# Patient Record
Sex: Female | Born: 1955 | Race: White | Hispanic: No | Marital: Married | Smoking: Former smoker
Health system: Southern US, Community
[De-identification: ages and names within clinical notes are randomized; demographics above are authoritative.]

## PROBLEM LIST (undated history)

## (undated) DIAGNOSIS — D497 Neoplasm of unspecified behavior of endocrine glands and other parts of nervous system: Secondary | ICD-10-CM

## (undated) DIAGNOSIS — M8580 Other specified disorders of bone density and structure, unspecified site: Secondary | ICD-10-CM

## (undated) DIAGNOSIS — K589 Irritable bowel syndrome without diarrhea: Secondary | ICD-10-CM

## (undated) DIAGNOSIS — E213 Hyperparathyroidism, unspecified: Secondary | ICD-10-CM

## (undated) DIAGNOSIS — C44722 Squamous cell carcinoma of skin of right lower limb, including hip: Secondary | ICD-10-CM

## (undated) DIAGNOSIS — K649 Unspecified hemorrhoids: Secondary | ICD-10-CM

## (undated) DIAGNOSIS — E538 Deficiency of other specified B group vitamins: Secondary | ICD-10-CM

## (undated) DIAGNOSIS — F329 Major depressive disorder, single episode, unspecified: Secondary | ICD-10-CM

## (undated) DIAGNOSIS — G43009 Migraine without aura, not intractable, without status migrainosus: Secondary | ICD-10-CM

## (undated) DIAGNOSIS — F32A Depression, unspecified: Secondary | ICD-10-CM

## (undated) DIAGNOSIS — N951 Menopausal and female climacteric states: Secondary | ICD-10-CM

## (undated) DIAGNOSIS — K5190 Ulcerative colitis, unspecified, without complications: Secondary | ICD-10-CM

## (undated) DIAGNOSIS — D649 Anemia, unspecified: Secondary | ICD-10-CM

## (undated) HISTORY — PX: PARATHYROIDECTOMY: SHX19

## (undated) HISTORY — DX: Anemia, unspecified: D64.9

## (undated) HISTORY — PX: COLONOSCOPY: SHX174

## (undated) HISTORY — DX: Irritable bowel syndrome, unspecified: K58.9

## (undated) HISTORY — DX: Depression, unspecified: F32.A

## (undated) HISTORY — DX: Menopausal and female climacteric states: N95.1

## (undated) HISTORY — DX: Migraine without aura, not intractable, without status migrainosus: G43.009

## (undated) HISTORY — DX: Squamous cell carcinoma of skin of right lower limb, including hip: C44.722

## (undated) HISTORY — PX: SQUAMOUS CELL CARCINOMA EXCISION: SHX2433

## (undated) HISTORY — DX: Ulcerative colitis, unspecified, without complications: K51.90

## (undated) HISTORY — DX: Major depressive disorder, single episode, unspecified: F32.9

## (undated) HISTORY — DX: Unspecified hemorrhoids: K64.9

## (undated) HISTORY — DX: Neoplasm of unspecified behavior of endocrine glands and other parts of nervous system: D49.7

## (undated) HISTORY — DX: Hyperparathyroidism, unspecified: E21.3

## (undated) HISTORY — DX: Deficiency of other specified B group vitamins: E53.8

## (undated) HISTORY — DX: Other specified disorders of bone density and structure, unspecified site: M85.80

---

## 1993-10-31 HISTORY — PX: EXPLORATORY LAPAROTOMY: SUR591

## 1993-11-28 HISTORY — PX: PELVIC LAPAROSCOPY: SHX162

## 1995-08-11 HISTORY — PX: LAPAROSCOPIC VAGINAL HYSTERECTOMY: SUR798

## 1996-06-30 DIAGNOSIS — K649 Unspecified hemorrhoids: Secondary | ICD-10-CM

## 1996-06-30 HISTORY — DX: Unspecified hemorrhoids: K64.9

## 1998-04-28 ENCOUNTER — Other Ambulatory Visit: Admission: RE | Admit: 1998-04-28 | Discharge: 1998-04-28 | Payer: Self-pay | Admitting: Gastroenterology

## 1998-08-30 DIAGNOSIS — D497 Neoplasm of unspecified behavior of endocrine glands and other parts of nervous system: Secondary | ICD-10-CM

## 1998-08-30 HISTORY — DX: Neoplasm of unspecified behavior of endocrine glands and other parts of nervous system: D49.7

## 1998-11-09 ENCOUNTER — Ambulatory Visit (HOSPITAL_COMMUNITY): Admission: RE | Admit: 1998-11-09 | Discharge: 1998-11-09 | Payer: Self-pay

## 1998-11-21 ENCOUNTER — Inpatient Hospital Stay (HOSPITAL_COMMUNITY): Admission: RE | Admit: 1998-11-21 | Discharge: 1998-11-22 | Payer: Self-pay

## 1999-01-12 ENCOUNTER — Ambulatory Visit (HOSPITAL_COMMUNITY): Admission: RE | Admit: 1999-01-12 | Discharge: 1999-01-12 | Payer: Self-pay

## 1999-01-18 ENCOUNTER — Ambulatory Visit (HOSPITAL_COMMUNITY): Admission: RE | Admit: 1999-01-18 | Discharge: 1999-01-18 | Payer: Self-pay

## 1999-08-22 ENCOUNTER — Encounter: Admission: RE | Admit: 1999-08-22 | Discharge: 1999-08-22 | Payer: Self-pay | Admitting: Gastroenterology

## 1999-08-22 ENCOUNTER — Encounter: Payer: Self-pay | Admitting: Gastroenterology

## 1999-08-27 ENCOUNTER — Inpatient Hospital Stay (HOSPITAL_COMMUNITY): Admission: EM | Admit: 1999-08-27 | Discharge: 1999-09-04 | Payer: Self-pay | Admitting: Emergency Medicine

## 1999-08-27 ENCOUNTER — Encounter: Payer: Self-pay | Admitting: Emergency Medicine

## 1999-08-27 ENCOUNTER — Encounter (INDEPENDENT_AMBULATORY_CARE_PROVIDER_SITE_OTHER): Payer: Self-pay | Admitting: Specialist

## 2000-02-14 ENCOUNTER — Ambulatory Visit (HOSPITAL_COMMUNITY): Admission: AD | Admit: 2000-02-14 | Discharge: 2000-02-14 | Payer: Self-pay | Admitting: *Deleted

## 2000-03-21 ENCOUNTER — Ambulatory Visit (HOSPITAL_COMMUNITY): Admission: RE | Admit: 2000-03-21 | Discharge: 2000-03-21 | Payer: Self-pay

## 2000-03-24 ENCOUNTER — Emergency Department (HOSPITAL_COMMUNITY): Admission: EM | Admit: 2000-03-24 | Discharge: 2000-03-24 | Payer: Self-pay | Admitting: Emergency Medicine

## 2000-08-04 ENCOUNTER — Encounter (INDEPENDENT_AMBULATORY_CARE_PROVIDER_SITE_OTHER): Payer: Self-pay | Admitting: *Deleted

## 2000-08-05 ENCOUNTER — Inpatient Hospital Stay (HOSPITAL_COMMUNITY): Admission: AD | Admit: 2000-08-05 | Discharge: 2000-08-06 | Payer: Self-pay

## 2000-12-05 ENCOUNTER — Encounter: Payer: Self-pay | Admitting: Family Medicine

## 2000-12-05 ENCOUNTER — Ambulatory Visit (HOSPITAL_COMMUNITY): Admission: RE | Admit: 2000-12-05 | Discharge: 2000-12-05 | Payer: Self-pay | Admitting: Family Medicine

## 2000-12-31 ENCOUNTER — Other Ambulatory Visit: Admission: RE | Admit: 2000-12-31 | Discharge: 2000-12-31 | Payer: Self-pay | Admitting: *Deleted

## 2002-01-05 ENCOUNTER — Ambulatory Visit (HOSPITAL_BASED_OUTPATIENT_CLINIC_OR_DEPARTMENT_OTHER): Admission: RE | Admit: 2002-01-05 | Discharge: 2002-01-06 | Payer: Self-pay | Admitting: Otolaryngology

## 2005-04-23 ENCOUNTER — Ambulatory Visit: Payer: Self-pay | Admitting: Gastroenterology

## 2005-05-01 ENCOUNTER — Ambulatory Visit: Payer: Self-pay | Admitting: Gastroenterology

## 2005-05-08 ENCOUNTER — Ambulatory Visit: Payer: Self-pay | Admitting: Gastroenterology

## 2005-05-15 ENCOUNTER — Ambulatory Visit: Payer: Self-pay | Admitting: Gastroenterology

## 2005-06-13 ENCOUNTER — Ambulatory Visit: Payer: Self-pay | Admitting: Gastroenterology

## 2005-07-17 ENCOUNTER — Ambulatory Visit: Payer: Self-pay | Admitting: Gastroenterology

## 2005-08-30 ENCOUNTER — Ambulatory Visit: Payer: Self-pay | Admitting: Gastroenterology

## 2005-10-04 ENCOUNTER — Ambulatory Visit: Payer: Self-pay | Admitting: Gastroenterology

## 2005-11-15 ENCOUNTER — Ambulatory Visit: Payer: Self-pay | Admitting: Gastroenterology

## 2006-01-22 ENCOUNTER — Ambulatory Visit: Payer: Self-pay | Admitting: Gastroenterology

## 2006-03-20 ENCOUNTER — Ambulatory Visit: Payer: Self-pay | Admitting: Gastroenterology

## 2006-05-13 ENCOUNTER — Ambulatory Visit: Payer: Self-pay | Admitting: Gastroenterology

## 2006-09-24 ENCOUNTER — Ambulatory Visit (HOSPITAL_COMMUNITY): Admission: RE | Admit: 2006-09-24 | Discharge: 2006-09-24 | Payer: Self-pay | Admitting: Obstetrics and Gynecology

## 2007-05-05 ENCOUNTER — Ambulatory Visit: Payer: Self-pay | Admitting: Gastroenterology

## 2007-05-05 LAB — CONVERTED CEMR LAB
ALT: 14 units/L (ref 0–35)
AST: 16 units/L (ref 0–37)
BUN: 10 mg/dL (ref 6–23)
Basophils Relative: 0.4 % (ref 0.0–1.0)
Bilirubin Urine: NEGATIVE
Calcium Ionized: 1.3 mmol/L (ref 1.12–1.32)
Calcium, Total (PTH): 11.1 mg/dL — ABNORMAL HIGH (ref 8.4–10.5)
Chloride: 108 meq/L (ref 96–112)
Cholesterol: 227 mg/dL (ref 0–200)
Direct LDL: 131.1 mg/dL
Glucose, Bld: 100 mg/dL — ABNORMAL HIGH (ref 70–99)
HCT: 37.1 % (ref 36.0–46.0)
Hemoglobin: 12.5 g/dL (ref 12.0–15.0)
Ketones, ur: NEGATIVE mg/dL
MCV: 101.4 fL — ABNORMAL HIGH (ref 78.0–100.0)
Monocytes Relative: 7.1 % (ref 3.0–11.0)
Neutrophils Relative %: 69.8 % (ref 43.0–77.0)
PTH: 60.2 pg/mL (ref 14.0–72.0)
RBC: 3.66 M/uL — ABNORMAL LOW (ref 3.87–5.11)
Specific Gravity, Urine: <=1.005 (ref 1.000–1.03)
TSH: 2.6 microintl units/mL (ref 0.35–5.50)
Total Bilirubin: 1 mg/dL (ref 0.3–1.2)
Total CHOL/HDL Ratio: 3.2
Total Protein, Urine: NEGATIVE mg/dL
Triglycerides: 91 mg/dL (ref 0–149)
Urine Glucose: NEGATIVE mg/dL
Urobilinogen, UA: 0.2 (ref 0.0–1.0)
VLDL: 18 mg/dL (ref 0–40)
Vitamin B-12: 378 pg/mL (ref 211–911)

## 2007-06-04 ENCOUNTER — Encounter: Payer: Self-pay | Admitting: Endocrinology

## 2007-06-04 ENCOUNTER — Ambulatory Visit: Payer: Self-pay | Admitting: Endocrinology

## 2007-06-04 DIAGNOSIS — R519 Headache, unspecified: Secondary | ICD-10-CM | POA: Insufficient documentation

## 2007-06-04 DIAGNOSIS — R51 Headache: Secondary | ICD-10-CM | POA: Insufficient documentation

## 2007-06-15 ENCOUNTER — Encounter: Admission: RE | Admit: 2007-06-15 | Discharge: 2007-06-15 | Payer: Self-pay | Admitting: Endocrinology

## 2007-06-17 ENCOUNTER — Encounter: Payer: Self-pay | Admitting: Gastroenterology

## 2007-06-17 ENCOUNTER — Ambulatory Visit: Payer: Self-pay | Admitting: Gastroenterology

## 2008-01-11 DIAGNOSIS — E213 Hyperparathyroidism, unspecified: Secondary | ICD-10-CM | POA: Insufficient documentation

## 2008-02-17 ENCOUNTER — Ambulatory Visit (HOSPITAL_COMMUNITY): Admission: RE | Admit: 2008-02-17 | Discharge: 2008-02-17 | Payer: Self-pay | Admitting: Obstetrics and Gynecology

## 2008-05-10 ENCOUNTER — Encounter: Payer: Self-pay | Admitting: Gastroenterology

## 2008-06-02 ENCOUNTER — Ambulatory Visit: Payer: Self-pay | Admitting: Gastroenterology

## 2008-06-02 DIAGNOSIS — N951 Menopausal and female climacteric states: Secondary | ICD-10-CM | POA: Insufficient documentation

## 2008-06-02 DIAGNOSIS — K5190 Ulcerative colitis, unspecified, without complications: Secondary | ICD-10-CM | POA: Insufficient documentation

## 2008-06-02 LAB — CONVERTED CEMR LAB
Calcium, Total (PTH): 10.8 mg/dL — ABNORMAL HIGH (ref 8.4–10.5)
PTH: 70.6 pg/mL (ref 14.0–72.0)

## 2008-06-07 LAB — CONVERTED CEMR LAB
ALT: 18 units/L (ref 0–35)
Alkaline Phosphatase: 30 units/L — ABNORMAL LOW (ref 39–117)
BUN: 11 mg/dL (ref 6–23)
Bilirubin, Direct: 0.1 mg/dL (ref 0.0–0.3)
Calcium: 10.3 mg/dL (ref 8.4–10.5)
Creatinine, Ser: 0.7 mg/dL (ref 0.4–1.2)
Eosinophils Relative: 0.6 % (ref 0.0–5.0)
Folate: 18.3 ng/mL
HCT: 38.9 % (ref 36.0–46.0)
Hemoglobin: 13.6 g/dL (ref 12.0–15.0)
Lymphocytes Relative: 27.5 % (ref 12.0–46.0)
MCHC: 34.9 g/dL (ref 30.0–36.0)
MCV: 101.9 fL — ABNORMAL HIGH (ref 78.0–100.0)
Monocytes Absolute: 0.2 10*3/uL (ref 0.1–1.0)
Monocytes Relative: 5.7 % (ref 3.0–12.0)
Platelets: 293 10*3/uL (ref 150–400)
Potassium: 4.4 meq/L (ref 3.5–5.1)
RDW: 12.1 % (ref 11.5–14.6)
Saturation Ratios: 44.5 % (ref 20.0–50.0)
Sodium: 141 meq/L (ref 135–145)
Total Protein: 6.4 g/dL (ref 6.0–8.3)
Vitamin B-12: 210 pg/mL — ABNORMAL LOW (ref 211–911)
WBC: 4.3 10*3/uL — ABNORMAL LOW (ref 4.5–10.5)

## 2008-06-08 ENCOUNTER — Ambulatory Visit: Payer: Self-pay | Admitting: Gastroenterology

## 2008-06-08 DIAGNOSIS — E538 Deficiency of other specified B group vitamins: Secondary | ICD-10-CM | POA: Insufficient documentation

## 2008-06-15 ENCOUNTER — Ambulatory Visit: Payer: Self-pay | Admitting: Gastroenterology

## 2008-06-22 ENCOUNTER — Ambulatory Visit: Payer: Self-pay | Admitting: Gastroenterology

## 2009-01-27 ENCOUNTER — Telehealth: Payer: Self-pay | Admitting: Gastroenterology

## 2009-06-20 ENCOUNTER — Ambulatory Visit: Payer: Self-pay | Admitting: Gastroenterology

## 2009-06-20 DIAGNOSIS — C4499 Other specified malignant neoplasm of skin, unspecified: Secondary | ICD-10-CM | POA: Insufficient documentation

## 2009-06-21 ENCOUNTER — Telehealth (INDEPENDENT_AMBULATORY_CARE_PROVIDER_SITE_OTHER): Payer: Self-pay | Admitting: *Deleted

## 2009-06-21 LAB — CONVERTED CEMR LAB
AST: 17 units/L (ref 0–37)
Alkaline Phosphatase: 30 units/L — ABNORMAL LOW (ref 39–117)
BUN: 13 mg/dL (ref 6–23)
Basophils Absolute: 0 10*3/uL (ref 0.0–0.1)
Bilirubin, Direct: 0 mg/dL (ref 0.0–0.3)
Calcium: 10.5 mg/dL (ref 8.4–10.5)
Chloride: 105 meq/L (ref 96–112)
Creatinine, Ser: 0.6 mg/dL (ref 0.4–1.2)
Eosinophils Absolute: 0.1 10*3/uL (ref 0.0–0.7)
Folate: >20 ng/mL
GFR calc non Af Amer: 111.33 mL/min (ref >60)
Glucose, Bld: 99 mg/dL (ref 70–99)
Iron: 71 ug/dL (ref 42–145)
Lymphocytes Relative: 31.8 % (ref 12.0–46.0)
Lymphs Abs: 1.5 10*3/uL (ref 0.7–4.0)
MCV: 100.8 fL — ABNORMAL HIGH (ref 78.0–100.0)
Monocytes Relative: 6.2 % (ref 3.0–12.0)
PTH: 110.2 pg/mL — ABNORMAL HIGH (ref 14.0–72.0)
Platelets: 278 10*3/uL (ref 150.0–400.0)
RBC: 3.93 M/uL (ref 3.87–5.11)
Saturation Ratios: 23.5 % (ref 20.0–50.0)
Sodium: 139 meq/L (ref 135–145)
TSH: 1.81 microintl units/mL (ref 0.35–5.50)
Total Protein: 7 g/dL (ref 6.0–8.3)

## 2009-07-07 ENCOUNTER — Ambulatory Visit (HOSPITAL_COMMUNITY): Admission: RE | Admit: 2009-07-07 | Discharge: 2009-07-07 | Payer: Self-pay | Admitting: Obstetrics and Gynecology

## 2009-07-17 ENCOUNTER — Encounter: Admission: RE | Admit: 2009-07-17 | Discharge: 2009-07-17 | Payer: Self-pay | Admitting: Obstetrics and Gynecology

## 2010-04-10 ENCOUNTER — Ambulatory Visit: Payer: Self-pay | Admitting: Gastroenterology

## 2010-04-11 ENCOUNTER — Telehealth: Payer: Self-pay | Admitting: Gastroenterology

## 2010-04-11 LAB — CONVERTED CEMR LAB
BUN: 11 mg/dL (ref 6–23)
Basophils Relative: 0.5 % (ref 0.0–3.0)
Bilirubin, Direct: 0.1 mg/dL (ref 0.0–0.3)
CO2: 27 meq/L (ref 19–32)
Calcium, Total (PTH): 11 mg/dL — ABNORMAL HIGH (ref 8.4–10.5)
Calcium: 10.9 mg/dL — ABNORMAL HIGH (ref 8.4–10.5)
Chloride: 112 meq/L (ref 96–112)
Eosinophils Absolute: 0 10*3/uL (ref 0.0–0.7)
Ferritin: 10.4 ng/mL (ref 10.0–291.0)
Hemoglobin: 13.2 g/dL (ref 12.0–15.0)
Lymphs Abs: 1.2 10*3/uL (ref 0.7–4.0)
MCHC: 35.2 g/dL (ref 30.0–36.0)
Monocytes Absolute: 0.3 10*3/uL (ref 0.1–1.0)
Neutro Abs: 3 10*3/uL (ref 1.4–7.7)
Platelets: 250 10*3/uL (ref 150.0–400.0)
RDW: 13.4 % (ref 11.5–14.6)
Sodium: 143 meq/L (ref 135–145)
TSH: 2.15 microintl units/mL (ref 0.35–5.50)
Transferrin: 241.1 mg/dL (ref 212.0–360.0)
Vitamin B-12: 685 pg/mL (ref 211–911)
WBC: 4.6 10*3/uL (ref 4.5–10.5)

## 2010-05-10 ENCOUNTER — Encounter: Payer: Self-pay | Admitting: Gastroenterology

## 2010-08-21 ENCOUNTER — Ambulatory Visit (HOSPITAL_COMMUNITY)
Admission: RE | Admit: 2010-08-21 | Discharge: 2010-08-21 | Payer: Self-pay | Source: Home / Self Care | Admitting: Obstetrics and Gynecology

## 2010-11-01 NOTE — Assessment & Plan Note (Signed)
Summary: 48yr f/u--ch.    History of Present Illness Visit Type: Follow-up Visit Primary GI Parks: DVerl BlalockMD FCarbondalePrimary Provider: RSamara Snide Parks  Requesting Provider: na Chief Complaint: Ulcerative colitis f/u. Pt states that she is great and denies any GI complaints  History of Present Illness:   [name] any GI or general medical problems. 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 She has altered colitis has been remission for many years taking 6-MP 50 mg a day. She's had previous parathyroidectomy surgery and apparently is followed by Ellen Parks Blood work from this past fall was normal except for slightly increased PTH level. She apparently has had recent bone scan and takes D. 3000 units once a week. She is on multiple medications for migraine headaches.  Chart review shows no evidence of bone marrow suppression or abnormal liver function test with her 6-MP. She is having regular bowel movements without melena or hematochezia. She is up-to-date on her colonoscopy exams. She denies frequent infections, any neurological or other endocrine problems. She B12 levels have been elevated.   GI Review of Systems      Denies abdominal pain, acid reflux, belching, bloating, chest pain, dysphagia with liquids, dysphagia with solids, heartburn, loss of appetite, nausea, vomiting, vomiting blood, weight loss, and  weight gain.        Denies anal fissure, black tarry stools, change in bowel habit, constipation, diarrhea, diverticulosis, fecal incontinence, heme positive stool, hemorrhoids, irritable bowel syndrome, jaundice, light color stool, liver problems, rectal bleeding, and  rectal pain.    Current Medications (verified): 1)  Premarin 0.625 Mg Tabs (Estrogens Conjugated) .... One Tablet By Mouth Once Daily 2)  Mercaptopurine 50 Mg  Tabs (Mercaptopurine) .... Take 1 Tablet By Mouth Once A Day. Must Have Office Visit For  Any Further Refills! 3)  Zomig 5 Mg Tabs (Zolmitriptan) .... One By Mouth As Needed For Headaches 4)  Topamax 100 Mg Tabs (Topiramate) .... 1/2 Tablet By Mouth Once Daily 5)  Vitamin D3 1000 Unit Caps (Cholecalciferol) .... Take 1 Capsule Once Weekly 6)  Nascobal 500 Mcg/0.135mSoln (Cyanocobalamin) .... One Spray in One Nostral Once Weekly  Allergies (verified): No Known Drug Allergies  Past History:  Past medical, surgical, family and social histories (including risk factors) reviewed for relevance to current acute and chronic problems.  Past Medical History: Reviewed history from 06/02/2008 and no changes required. ULCERATIVE COLITIS (ICD-556.9) HYPERPARATHYROIDISM UNSPECIFIED (ICD-252.00) HEADACHE (ICD-784.0)    Past Surgical History: Reviewed history from 06/20/2009 and no changes required. T A H and B S O Thyroid Surgey x 2 Squamous cell carcinoma (L)  Family History: Reviewed history from 06/20/2009 and no changes required. neg for parathyroid dz Family History of Heart Disease: Father No FH of Colon Cancer:  Social History: Reviewed history from 06/02/2008 and no changes required. Divorced works mgHoliday representativeor am[employer] Patient currently smokes.  Alcohol Use - yes Daily Caffeine Use Illicit Drug Use - no  Review of Systems  The patient denies allergy/sinus, anemia, anxiety-new, arthritis/joint pain, back pain, blood in urine, breast changes/lumps, change in vision, confusion, cough, coughing up blood, depression-new, fainting, fatigue, fever, headaches-new, hearing problems, heart murmur, heart rhythm changes, itching, menstrual pain, muscle pains/cramps, night sweats, nosebleeds, pregnancy symptoms, shortness of breath, skin rash, sleeping problems, sore throat, swelling of feet/legs, swollen lymph glands, thirst - excessive , urination - excessive , urination changes/pain, urine leakage, vision changes, and voice change.  Vital Signs:  Patient profile:   55  year old female Height:      63 inches Weight:      112 pounds BMI:     19.91 BSA:     1.51 Pulse rate:   68 / minute Pulse rhythm:   regular BP sitting:   116 / 74  (left arm) Cuff size:   regular  Vitals Entered By: Hope Pigeon CMA (April 10, 2010 11:00 AM)  Physical Exam  General:  Well developed, well nourished, no acute distress.healthy appearing.   Head:  Normocephalic and atraumatic. Eyes:  PERRLA, no icterus. Lungs:  Clear throughout to auscultation. Heart:  Regular rate and rhythm; no murmurs, rubs,  or bruits. Abdomen:  Soft, nontender and nondistended. No masses, hepatosplenomegaly or hernias noted. Normal bowel sounds. Extremities:  No clubbing, cyanosis, edema or deformities noted. Neurologic:  Alert and  oriented x4;  grossly normal neurologically. Skin:  Healing hyperpigmented areas on her lower extremities from previous dermatologic surgery. There is no evidence of edema, phlebitis, or cellulitis. Psych:  Alert and cooperative. Normal mood and affect.   Impression & Recommendations:  Problem # 1:  ULCERATIVE COLITIS (ICD-556.9) Assessment Improved Continue 6-MP 50 mg a day. Lab data ordered for review. Orders: TLB-CBC Platelet - w/Differential (85025-CBCD) TLB-BMP (Basic Metabolic Panel-BMET) (98338-SNKNLZJ) TLB-Hepatic/Liver Function Pnl (80076-HEPATIC) TLB-TSH (Thyroid Stimulating Hormone) (84443-TSH) TLB-B12, Serum-Total ONLY (67341-P37) TLB-Ferritin (90240-XBD) TLB-Folic Acid (Folate) (53299-MEQ) TLB-IBC Pnl (Iron/FE;Transferrin) (83550-IBC) T-Parathyroid Hormone, Intact (68341-96222)  Problem # 2:  B12 DEFICIENCY (ICD-266.2) Assessment: Improved Continue weekly nasal B12 spray. Orders: TLB-CBC Platelet - w/Differential (85025-CBCD) TLB-BMP (Basic Metabolic Panel-BMET) (97989-QJJHERD) TLB-Hepatic/Liver Function Pnl (80076-HEPATIC) TLB-TSH (Thyroid Stimulating Hormone) (84443-TSH) TLB-B12, Serum-Total ONLY (40814-G81) TLB-Ferritin  (85631-SHF) TLB-Folic Acid (Folate) (02637-CHY) TLB-IBC Pnl (Iron/FE;Transferrin) (83550-IBC) T-Parathyroid Hormone, Intact (85027-74128)  Problem # 3:  HYPERPARATHYROIDISM UNSPECIFIED (ICD-252.00) Assessment: Unchanged Serum calcium and PTH level ordered and will forward to primary care. She may need repeat endocrine evaluation with Dr. Loanne Drilling.  Problem # 4:  MENOPAUSAL SYNDROME (ICD-627.2) Assessment: Unchanged  Problem # 5:  HEADACHE (ICD-784.0) Assessment: Improved  Patient Instructions: 1)  Please go to the basement for lab work. 2)  Please continue current medications.  3)  Please schedule a follow-up appointment in 1 year. 4)  The medication list was reviewed and reconciled.  All changed / newly prescribed medications were explained.  A complete medication list was provided to the patient / caregiver. 5)  Copy sent to : Dr. Mickel Crow and Dr. Renato Shin  Appended Document: 85yr f/u--ch.    Clinical Lists Changes  Medications: Changed medication from MERCAPTOPURINE 50 MG  TABS (MERCAPTOPURINE) Take 1 tablet by mouth once a day. MUST HAVE OFFICE VISIT FOR ANY FURTHER REFILLS! to MERCAPTOPURINE 50 MG  TABS (MERCAPTOPURINE) Take 1 tablet by mouth once a day. - Signed Rx of MERCAPTOPURINE 50 MG  TABS (MERCAPTOPURINE) Take 1 tablet by mouth once a day.;  #90 x 3;  Signed;  Entered by: Ellen Parks;  Authorized by: Ellen Parks  Method used: [place] to [facility] , , [place] , Ph: [phone] Fax: [phone] of NASCOBAL 500 MCG/0.1ML SOLN (CYANOCOBALAMIN) one spray in one nostral once weekly;  #3 x 3;  Signed;  Entered by: Ellen Parks;  Authorized by: Ellen FeilMD [facility]  Method used: Faxed to [place] , , [place] , Ph: [phone] Fax: [phone]   Prescriptions: NASCOBAL 500 MCG/0.1ML SOLN (CYANOCOBALAMIN) one spray in one  nostral once weekly  #3 x 3   Entered by:   Alberteen Spindle RN   Authorized by:   Sable Feil Parks [facility]   Signed by:    Alberteen Spindle RN on 04/10/2010   Method used:   Faxed to ...       [facility] (mail-order)             , [place]         Ph: [phone]       Fax: [phone]   RxID:   [id number] MERCAPTOPURINE 50 MG  TABS (MERCAPTOPURINE) Take 1 tablet by mouth once a day.  #90 x 3   Entered by:   Alberteen Spindle RN   Authorized by:   Sable Feil Parks [facility]   Signed by:   Alberteen Spindle RN on 04/10/2010   Method used:   Faxed to ...       [facility] (mail-order)             , [place]         Ph: [phone]       Fax: [phone]   RxID:   [id number]

## 2010-11-01 NOTE — Letter (Signed)
Summary: [facility]   Imported By: Phillis Knack 05/18/2010 10:57:16  _____________________________________________________________________  External Attachment:    Type:   Image     Comment:   External Document

## 2010-11-01 NOTE — Progress Notes (Signed)
Summary: referral   Phone Note Call from Patient   Caller: Patient Summary of Call: Pt would like to go to the same doctor that she saw before a Bowmay Gray re parathyorid.  She can not remember to doctor's name.  States it was some one that Dr. Sharlett Iles knew personally.  Dr. Sharlett Iles had gone to school withe this doctor and pt thinks his first name was Shanon Brow.  Does Dr. Sharlett Iles know who she is taking about? Initial call taken by: Alberteen Spindle RN,  April 11, 2010 4:06 PM  Follow-up for Phone Call        Patient called to request we fax records to [facility] for her Parathyroid. [phone] Follow-up by: Irwin Brakeman [facility],  April 12, 2010 12:16 PM    Additional Follow-up for Phone Call Additional follow up Details #2::    DR. Zena Amos...SURGERY... Follow-up by: Sable Feil MD [place] 4:10 PM  Additional Follow-up for Phone Call Additional follow up Details #3:: Details for Additional Follow-up Action Taken: Records faxed as requested.  Lm for pt re name of doctor. Additional Follow-up by: Alberteen Spindle RN,  April 12, 2010 12:30 PM

## 2011-02-12 NOTE — Assessment & Plan Note (Signed)
[facility] OFFICE NOTE   Ellen Parks, Ellen Parks                     MRN:          [id number]  DATE:05/05/2007                            DOB:          [birth year]    SUBJECTIVE:  Ellen Parks comes in for a yearly visit for ulcerative colitis  which is in remission on 50 mg of 6MP a day.  She has been on 6MP now  for seven to eight years.  She has not been on prednisone.  She self-  discontinued her Asacol because of insurance problems.  She denies  abdominal pain, diarrhea or rectal bleeding.  Actually she does have  some periodic crampy mid-lower abdominal pain.  Denies any specific  urologic symptoms.   She is status post hysterectomy.  She had surgery in 2000, for  hyperparathyroidism.  She was previously followed by Dr. Hedda Slade  from [facility], but does not see him at this time.  She had  laboratory data done last year which did show a slightly elevated serum  calcium level of 10.8 mg%.  Her other labs including lipid levels, B12  and folate have all been normal.   She continues to have migraine headaches and takes Zomig on a p.r.n.  basis.  She also is on Premarin 0.625 mg daily.  She denies any other  chronic medical problems or issues.  She specifically denies any  shortness of breath, chest pain on exertion, dyspnea, cough, sputum  production, anorexia, weight loss or any neuropsychiatric problems  besides her migraine headaches.   FAMILY HISTORY:  Noncontributory except for hypercholesterolemia and  cardiovascular issues.   PHYSICAL EXAMINATION:  GENERAL:  She is an attractive healthy-appearing  white female, in no distress.  VITAL SIGNS:  She weighs 110 pounds, blood pressure 110/78, pulse 68 and  regular.  HEENT:  I could not appreciate stigmata of chronic liver disease.  NECK:  No thyromegaly or lymphadenopathy.  The neck exam was  unremarkable.  CHEST:  Entirely clear.  HEART:  She  is in a regular rhythm without murmurs, gallops or rubs.  ABDOMEN:  I could not appreciate hepatosplenomegaly, abdominal masses or  tenderness.  Bowel sounds were normal.  EXTREMITIES:  Peripheral extremities were unremarkable.  NEUROLOGIC:  Mental status was clear.  RECTAL:  Deferred.   ASSESSMENT:  1. Ulcerative colitis, in remission on low-dose 6MP therapy.  2. History of hyperparathyroidism which needs followup at this time.  3. Status post total abdominal hysterectomy and bilateral salpingo-      oophorectomy gynecologic surgery.  4. History of chronic migraine headaches.  5. Need for a screening sigmoidoscopy because of the longevity of her      colitis and her age.   RECOMMENDATIONS:  1. Check screening laboratory parameters including iodinized calcium      and PTH level.  2. Renew all medications at her request.  3. Outpatient colonoscopy and dysplasia screening.  4. Consider an endocrine referral, depending upon her workup.  5. I will urge the patient at the time of colonoscopy that she needs      blood counts  at least every six months.  We need to also at this      time contemplate stopping her 6MP, until she has been in remission      for at least five years.     Loralee Pacas. Sharlett Iles, MD, [facility] [place], [place]  Electronically Signed    DRP/MedQ  DD: 05/05/2007  DT: 05/05/2007  Job #: [id number]

## 2011-02-15 NOTE — Op Note (Signed)
[place]  Patient:    Ellen Parks, Ellen Parks Visit Number: [id number] MRN: [id number]          Service Type: DSU Location: [facility] Attending Physician:  Rayfield Citizen Dictated by:   Latricia Heft. Janace Hoard, M.D. Proc. Date: 01/05/02 Admit Date:  01/05/2002 Discharge Date: 01/06/2002   CC:         Minette Brine, M.D.   Operative Report  PREOPERATIVE DIAGNOSIS:  Right true vocal cord paralysis.  POSTOPERATIVE DIAGNOSIS:  Right true vocal cord paralysis.  OPERATION PERFORMED:  Right laryngoplasty with Silastic medialization.  SURGEON:  Latricia Heft. Janace Hoard, M.D.  ANESTHESIA:  Local with sedation.  ESTIMATED BLOOD LOSS:  Less than 5 cc.  INDICATIONS FOR PROCEDURE:  The patient is a 55 year old who has had a paratbhyroid resection and has sustained a right vocal cord paralysis that has persisted for greater than a year.  She continues to have a weak voice and a hoarse voice.  She now wants to proceed with a laryngoplasty procedure.  She was informed of the risks and benefits of the procedure, including bleeding, infection, scarring, not a normal voice, airway obstruction, extrusion of the implant, and risk of the anesthetic.  All questions were answered and consent was obtained.  DESCRIPTION OF PROCEDURE:  The patient was taken to the operating room and placed in supine position.  After adequate local sedation with 1% lidocaine with 1:100,000 epinephrine in the incision, an incision was made right over the thyroid cartilage.  Electrocautery was used to dissect down to the strap muscles where the diastasis of the strap muscles was divided.  The cartilage of the thyroid lamina was identified.  An outline of an incision was made in the cartilage about 1 mm above the inferior rim of the thyroid cartilage and about 2 mm from the anterior edge of the thyroid midline.  The box or window was approximately 8 mm x 4 mm.  The cartilage was removed with a 15  blade and then the interior perichondrium was divided and the dissection of the inner perichondrium was performed.  The Valora Corporal was placed into this pocket and using the fiberoptic scope, the larynx was examined and determined how much medialization it required for a better voice and medialization.  The Silastic implant was then fashioned and carved to this shape and the implant was placed.  She had a much better voice, much stronger and she seemed to be happy with this as well as I with this sound.  The implant was then secured anteriorly with a 4-0 nylon suture.  There was good hemostasis prior to the inset of the implant.  The strap muscles were then closed over a rubber band drain with interrupted 4-0 chromic and 4-0 chromic used to close the subcutaneous tissues.  The skin was loosely closed with 4-0 nylon.  The patient was rescoped with the fiberoptic scope and there was no evidence of any significant edema of the larynx and no evidence of ecchymosis or bleeding or swelling of the vocal cords.  The patient was then taken to the recovery room in stable condition.  Counts correct. Dictated by:   Latricia Heft. Janace Hoard, M.D. Attending Physician:  Rayfield Citizen DD:  01/05/02 TD:  01/05/02 Job: [id number]

## 2011-02-15 NOTE — Assessment & Plan Note (Signed)
[facility]                           GASTROENTEROLOGY OFFICE NOTE   AALEYAH, WITHEROW                     MRN:          [id number]  DATE:05/13/2006                            DOB:          12/29/1955    Ms. Ellen Parks has had ulcerative colitis now for 10 years.  It has been on  remission on 6-MP for the last several years.  She also takes Asacol 400 mg  t.i.d.  She actually has not had any clinical symptomatology since being on  6-MP since 2004.  Her last colonoscopy was in 2000.  She is fairly regular  about coming in once a year but does not come in more often than this.  She  does have a macrocytosis from Purinethol but has had no evidence of anemia  or leukopenia.   She really denies complaints today, but she has had some anorexia and weight  loss.  She denies abdominal pain, nausea or vomiting, diarrhea, rectal  bleeding, or any systemic complaints otherwise.   PAST MEDICAL HISTORY:  Previous hyperparathyroidism with parathyroid adenoma  removal by Dr. Leafy Kindle years ago.  She was followed by Dr. Jenean Lindau at [facility], and I am not exactly sure who she is seeing at this time  as her primary care physician.   MEDICATIONS:  1. Prozac at bedtime.  2. Asacol 400 mg t.i.d.  3. 6-MP 50 mg a day.  4. Topamax 50 mg a day.  5. Phenergan p.r.n.   PHYSICAL EXAMINATION:  GENERAL:  She is a somewhat thin but healthy-  appearing white female in no acute distress, without stigmata of chronic  liver disease.  VITAL SIGNS:  She weighs 106 pounds, which is down 9 pounds from her normal  weight.  Blood pressure is 98/62, pulse was 64 and regular.  CHEST:  Clear.  I could not appreciate murmurs, gallops or rubs.  CARDIAC:  She was in a regular rhythm.  ABDOMEN:  No organomegaly, masses or tenderness.  Bowel sounds were normal.  EXTREMITIES:  Peripheral extremities were unremarkable.  NEUROLOGIC:  Mental status was clear.  RECTAL:   Deferred.   ASSESSMENT:  1. Ulcerative colitis, in remission on immunomodulator therapy.  2. Mild chronic depression with Prozac therapy.  3. Status post parathyroidectomy for parathyroid adenoma.  4. Mild anorexia and weight loss of questionable etiology.  5. Chronic ulcerative colitis with need for screening colonoscopy next      year.   RECOMMENDATIONS:  1. Check lab data including CBC, metabolic profile, V25, folate, iron      levels, thyroid functions, and carotene and sedimentation rate.  2. Renew 6-MP and Asacol.  3. Colonoscopy next year for dysplasia screen.  4. Follow-up p.r.n. as otherwise.  The patient is on B12 replacement      therapy.                                   Loralee Pacas. Sharlett Iles, MD, [place], [place]   DRP/MedQ  DD:  05/13/2006  DT:  05/14/2006  Job #:  [id number]

## 2011-02-15 NOTE — Discharge Summary (Signed)
[place]  Patient:    Ellen Parks, Ellen Parks                MRN: [id number] Adm. Date:  [id number] Disc. Date: [id number] Attending:  Allyn Kenner CC:         Pixie Casino, M.D.  Loralee Pacas. Sharlett Iles, M.D. 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  HISTORY OF PRESENT ILLNESS:  The patient underwent a right thyroid lobectomy approximately one and a half years ago for treatment of what was thought to be a parathyroid adenoma that was intrathyroidal and causing primary hyperparathyroidism.  There was parathyroid tissue confirmed being within the right lobe of the thyroid gland; however, she appeared to improve following the surgery, but then a few months ago developed recurrent primary hyperparathyroidism.  She had a sestamibi scan of her neck which revealed increased activity on the right side similar to what was seen initially before the first operation, and she was therefore brought back to the operating room for re-exploration of her neck after receiving sestamibi radionuclide agent approximately one hour prior to surgery.  Physical examination was unremarkable except for well-healed scar lower portion of her neck.  HOSPITAL COURSE:  On the day of admission the patient underwent bilateral exploration of her neck and was found to have a recurrent parathyroid adenoma of the right side of her neck.  It was resected.  The patient tolerated the procedure well and postoperatively the serum calcium was 9.1 mg%.  However, in the postoperative period she developed severe nausea and a migraine headache and had to be hospitalized for treatment of it.  By August 06, 2000, the migraine resolved, and the nausea was resolved.  The patient was afebrile. Her voice was normal, and she was swallowing good.  Her incision was healing, skin staples were removed, and  Steri-Strips applied.  Serum calcium was 9.5 mg%, and she was discharged for further care as an outpatient.  CONDITION ON DISCHARGE:  Satisfactory.  DISCHARGE INSTRUCTIONS:  Diet:  Ad lib.  Activity:  Limited to no strenuous activity.  DISCHARGE MEDICATIONS:  She was to resume her previous medications following discharge which included: 1. Premarin 0.625 mg q.d. 2. 6-MP 75 mg q.d. 3. Asacol 400 mg t.i.d. 4. Prednisone 5 mg q.d. DD:  09/09/00 TD:  09/09/00 Job: [id number]

## 2011-02-15 NOTE — Op Note (Signed)
[place]  Patient:    Ellen Parks, Ellen Parks                MRN: [id number] Proc. Date: 08/04/00 Adm. Date:  [id number] Attending:  Allyn Kenner CC:         Pixie Casino, M.D.  Loralee Pacas. Sharlett Iles, M.D. [facility]   Operative Report  PREOPERATIVE DIAGNOSIS:  Recurrent primary hyperparathyroidism.  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  OPERATION:  Bilateral neck exploration and resection of parathyroid adenoma of the right side of neck.  Biopsy of multiple nodules.  This was radio-guided.  SURGEON:  Jaci Carrel, M.D.  ASSISTANT:  Odis Hollingshead, M.D. and Nickola Major. Marlou Starks, M.D.  ANESTHESIA:  General endotracheal.  HISTORY:  This patient underwent a right thyroid lobectomy approximately 1-1/2 years ago for treatment of what was thought to be a parathyroid adenoma that was intrathyroidal and causing primary hyperparathyroidism.  There was parathyroid tissue confirmed to be within the right lobe of the thyroid gland. However, the patient did not have recovery from her primary hyperthyroidism despite this previous operation.  A few months ago, she had a sestamibi scan of her neck, which revealed increased activity on the right side similar to what was seen initially before the first operation.  She was therefore brought to the operating room for re-exploration of her neck after receiving sestamibi radionuclear agent approximately one hour prior to surgery.  DESCRIPTION OF PROCEDURE:  Under adequate general endotracheal anesthesia, the patients neck was prepared and draped in usual fashion.  Nuclear mapping of the neck was carried out using gamma probe and there was a hot area in the lateral aspect of the patients collar incision.  The incision was made through the previous scar and carried down through the platysma muscle. Superior and inferior platysmal  flaps were developed using Bovie electrocoagulation.  The scarred right sternohyoid muscle was divided with Bovie electrocoagulation.  The surgical plane of the previous thyroid gland was entered.  This was densely filled with scar tissue and dissection was quite tedious.  We first encountered a nodule just inferior to the incision and dissected it out with Bovie electrocoagulation and over clips.  The ex vivo counts of this nodule were quite low however and not suggestive of a parathyroid adenoma.  It was sent for routine pathologic study and was thought to be a probable lymph node.  The background counts for the neck were approximately 400-450 and there continued to be a hot spot on the right side of the neck that was in the range of seven to 800.  Further dissection was carried out superiorly and a nodule was delineated and tediously dissected out over small clips and electrocoagulation.  The lesion was excised over clips. The ex vivo counts were at least 20% of background and it was thought that this was probably parathyroid adenoma.  It was sent for frozen section study, which confirmed parathyroid tissue.  Hemostasis was ascertained.  Next, the left sternohyoid and sternothyroid muscles were divided using Bovie electrocoagulation.  The left side of the neck was explored.  The middle thyroid vein was divided over quadruple clip technique and this allowed for good mobility of the left lobe of the thyroid.  An inferior parathyroid gland was located just below the pole of the inferior pole of the thyroid and it appeared to be normal in appearance and size.  The left superior gland was  also noted and it was just inferior to the inferior thyroid artery and also was normal size and normal in appearance.  Having resected all of the parathyroid tissue on the right side of the neck that was thought to be present and not wanting to devascularize the left side parathyroid glands, no further  dissection was carried out.  Hemostasis was ascertained.   The strap muscles were repaired with interrupted sutures of 4-0 Vicryl.  The platysma muscle was likewise repaired and the skin was approximated with the generic skin stapler 35-W.  Sterile dressing was applied.  Estimated blood loss for the procedure was approximately 50 cc.  The patient tolerated the procedure well, left the operating room in satisfactory condition. DD:  08/04/00 TD:  08/04/00 Job: [id number]

## 2011-04-17 ENCOUNTER — Other Ambulatory Visit: Payer: Self-pay | Admitting: Gastroenterology

## 2011-04-17 MED ORDER — CYANOCOBALAMIN 500 MCG/0.1ML NA SOLN: NASAL | Status: DC

## 2011-04-17 NOTE — Telephone Encounter (Signed)
rx sent, pt aware

## 2011-05-07 ENCOUNTER — Telehealth: Payer: Self-pay | Admitting: *Deleted

## 2011-05-07 ENCOUNTER — Encounter: Payer: Self-pay | Admitting: Gastroenterology

## 2011-05-07 ENCOUNTER — Other Ambulatory Visit (INDEPENDENT_AMBULATORY_CARE_PROVIDER_SITE_OTHER): Payer: BC Managed Care – PPO

## 2011-05-07 ENCOUNTER — Ambulatory Visit (INDEPENDENT_AMBULATORY_CARE_PROVIDER_SITE_OTHER): Payer: BC Managed Care – PPO | Admitting: Gastroenterology

## 2011-05-07 DIAGNOSIS — K5289 Other specified noninfective gastroenteritis and colitis: Secondary | ICD-10-CM

## 2011-05-07 DIAGNOSIS — K5190 Ulcerative colitis, unspecified, without complications: Secondary | ICD-10-CM

## 2011-05-07 DIAGNOSIS — K529 Noninfective gastroenteritis and colitis, unspecified: Secondary | ICD-10-CM

## 2011-05-07 LAB — CBC WITH DIFFERENTIAL/PLATELET
Basophils Relative: 0.6 % (ref 0.0–3.0)
Eosinophils Absolute: 0 10*3/uL (ref 0.0–0.7)
Lymphocytes Relative: 29 % (ref 12.0–46.0)
MCV: 101 fl — ABNORMAL HIGH (ref 78.0–100.0)
Monocytes Absolute: 0.3 10*3/uL (ref 0.1–1.0)
Monocytes Relative: 7.4 % (ref 3.0–12.0)
Neutro Abs: 2.6 10*3/uL (ref 1.4–7.7)
Platelets: 272 10*3/uL (ref 150.0–400.0)

## 2011-05-07 LAB — BASIC METABOLIC PANEL
Calcium: 10.6 mg/dL — ABNORMAL HIGH (ref 8.4–10.5)
Chloride: 108 mEq/L (ref 96–112)
Potassium: 4.7 mEq/L (ref 3.5–5.1)
Sodium: 141 mEq/L (ref 135–145)

## 2011-05-07 LAB — HEPATIC FUNCTION PANEL
Albumin: 4.2 g/dL (ref 3.5–5.2)
Alkaline Phosphatase: 30 U/L — ABNORMAL LOW (ref 39–117)
Total Bilirubin: 1 mg/dL (ref 0.3–1.2)

## 2011-05-07 LAB — TSH: TSH: 1.9 u[IU]/mL (ref 0.35–5.50)

## 2011-05-07 LAB — FERRITIN: Ferritin: 16.8 ng/mL (ref 10.0–291.0)

## 2011-05-07 LAB — IBC PANEL
Iron: 134 ug/dL (ref 42–145)
Saturation Ratios: 39.3 % (ref 20.0–50.0)
Transferrin: 243.5 mg/dL (ref 212.0–360.0)

## 2011-05-07 LAB — FOLATE: Folate: >24.8 ng/mL (ref >5.9)

## 2011-05-07 LAB — VITAMIN B12: Vitamin B-12: 685 pg/mL (ref 211–911)

## 2011-05-07 NOTE — Telephone Encounter (Signed)
Message copied by Sheral Flow on Tue May 07, 2011  3:59 PM ------      Message from: Sharlett Iles, DAVID R      Created: Tue May 07, 2011  3:01 PM       Stop 6 mp.Marland KitchenMarland Kitchen

## 2011-05-07 NOTE — Patient Instructions (Signed)
Please go to the basement today for your labs.  After Dr Sharlett Iles reviews your labs we will call you about a decision on your 51m management.

## 2011-05-07 NOTE — Progress Notes (Signed)
History of Present Illness: This is a 55 year old Caucasian female status post parathyroidectomy for parathyroid adenomas, and is currently followed at [facility]. I have seen her for many years because of a long history of ulcerative colitis number currently in remission on 6-MP 50 mg a day which he is taken for many years. Patient relates her mission from [employer]. 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 She is up-to-date on her colonoscopy and dysplasia screens which have been negative, and she has laboratory parameters form periodically which have been unremarkable. She specifically denies abdominal cramps, diarrhea, rectal bleeding, upper gastrointestinal or hepatobiliary or systemic complaints.    Current Medications, Allergies, Past Medical History, Past Surgical History, Family History and Social History were reviewed in [employer] record.   Assessment and plan: I have decided to stop her 6-MP since she has been in remission for greater than 5 years. We will see how she does symptomatically. Labs have been ordered for review. I've urged her to continue her followup with endocrine physicians at [facility].  Please copy her primary care physician, referring physician, and pertinent subspecialists. Encounter Diagnosis  Name Primary?  . Ulcerative colitis Yes

## 2011-05-07 NOTE — Telephone Encounter (Signed)
Left message to stop 97m and I have mailed copy of all labs to her.

## 2011-05-31 ENCOUNTER — Other Ambulatory Visit: Payer: Self-pay | Admitting: Gastroenterology

## 2011-06-24 ENCOUNTER — Other Ambulatory Visit: Payer: Self-pay | Admitting: Dermatology

## 2011-08-09 ENCOUNTER — Telehealth: Payer: Self-pay | Admitting: Gastroenterology

## 2011-08-09 NOTE — Telephone Encounter (Signed)
Last OV 05/07/11, f/u for UC. Pt on 6MP 51m/day , UC in remission x 5 years. [facility] After labs were reviewed, she was instructed to stop the 6MP. lmom for pt to call back. Pt [facility] a [facility]? Also reports a little bleeding after BMs.

## 2011-08-12 MED ORDER — CYANOCOBALAMIN 500 MCG/0.1ML NA SOLN: NASAL | Status: DC

## 2011-08-12 NOTE — Telephone Encounter (Signed)
Pt lmom requesting her B12 and stating her bleeding is better. Spoke with pt and I ordered her Nascabol from [employer]. Pt reports her bleeding is better- she only sees some blood when she wipes. She's not sure if she has internal hemorrhoids or not. Pt will monitor her stools and let us know if bleeding worsens.

## 2011-08-20 ENCOUNTER — Telehealth: Payer: Self-pay | Admitting: Gastroenterology

## 2011-08-20 MED ORDER — PEG-KCL-NACL-NASULF-NA ASC-C 100 G PO SOLR: ORAL | Status: DC

## 2011-08-20 NOTE — Telephone Encounter (Signed)
Called [facility] to cancel script for Movi prep-[facility] have [facility].

## 2011-08-20 NOTE — Telephone Encounter (Signed)
On 08/12/11 pt called: Last OV 05/07/11, f/u for UC. Pt on 6MP 67m/day , UC in remission x 5 years. [facility] After labs were reviewed, she was instructed to stop the 6MP.  lmom for pt to call back.  Also reports a little bleeding after BMs. Nascobal was filled.  Today pt called back to report the bleeding hasn't stopped; she reports the bleeding started when she stopped the 6MP. She denies diarrhea, but reports BRB after each stool. Please advise.

## 2011-08-20 NOTE — Telephone Encounter (Signed)
Needs colonoscopy

## 2011-08-26 ENCOUNTER — Telehealth: Payer: Self-pay | Admitting: *Deleted

## 2011-08-26 NOTE — Telephone Encounter (Signed)
Spoke with patient and reviewed moviprep instructions with her including starting first prep at 5 PM tonight, 2nd prep at 5:30 AM and only clear liquids from now until 8:30 AM, then NPO.

## 2011-08-28 ENCOUNTER — Other Ambulatory Visit: Payer: Self-pay

## 2011-08-28 ENCOUNTER — Ambulatory Visit (AMBULATORY_SURGERY_CENTER): Payer: BC Managed Care – PPO | Admitting: Gastroenterology

## 2011-08-28 ENCOUNTER — Ambulatory Visit (HOSPITAL_COMMUNITY)
Admission: RE | Admit: 2011-08-28 | Discharge: 2011-08-28 | Disposition: A | Discharge status: Home / Self Care | Payer: BC Managed Care – PPO | Source: Ambulatory Visit | Attending: Gastroenterology | Admitting: Gastroenterology

## 2011-08-28 ENCOUNTER — Encounter: Payer: Self-pay | Admitting: Gastroenterology

## 2011-08-28 DIAGNOSIS — K5289 Other specified noninfective gastroenteritis and colitis: Secondary | ICD-10-CM

## 2011-08-28 DIAGNOSIS — K625 Hemorrhage of anus and rectum: Secondary | ICD-10-CM

## 2011-08-28 DIAGNOSIS — K5190 Ulcerative colitis, unspecified, without complications: Secondary | ICD-10-CM

## 2011-08-28 DIAGNOSIS — Z9889 Other specified postprocedural states: Secondary | ICD-10-CM | POA: Insufficient documentation

## 2011-08-28 DIAGNOSIS — R109 Unspecified abdominal pain: Secondary | ICD-10-CM | POA: Insufficient documentation

## 2011-08-28 MED ORDER — SODIUM CHLORIDE 0.9 % IV SOLN: 500.0000 mL | INTRAVENOUS | Status: DC

## 2011-08-28 MED ORDER — MESALAMINE 1000 MG RE SUPP: 1000.0000 mg | Freq: Every day | RECTAL | Status: DC

## 2011-08-28 NOTE — Progress Notes (Signed)
1210: Dr. Sharlett Iles phoned and asked to see patient. Up to Recovery to evaluate. Upon evaluation decided to send patient to [facility] for KUB, upright and flat. Pt dressing and will drive with spouse directly to radiology. Nicoletta Ba, PA message left on cell phone and also paged. Dr. Sharlett Iles aware T. Ennis unable to reach Amy, he states he spoke with her and she is aware. Radiology at [facility] called. Richardson Landry notified of pt coming and KUB orders. Pt sent with husband to [facility] at 1220.

## 2011-08-28 NOTE — Patient Instructions (Signed)
Discharge instructions given with verbal understanding. Biopsies taken. Resume previous medications.

## 2011-08-28 NOTE — Progress Notes (Signed)
Advised radiology to tell pt and she can call if she has questions.

## 2011-08-28 NOTE — Progress Notes (Signed)
Patient did not experience any of the following events: a burn prior to discharge; a fall within the facility; wrong site/side/patient/procedure/implant event; or a hospital transfer or hospital admission upon discharge from the facility. (G8907) Patient did not have preoperative order for IV antibiotic SSI prophylaxis. (G8918)  

## 2011-08-29 ENCOUNTER — Telehealth: Payer: Self-pay

## 2011-08-29 NOTE — Telephone Encounter (Signed)
Follow up Call- Patient questions:  Do you have a fever, pain , or abdominal swelling? no Pain Score  0 *  Have you tolerated food without any problems? yes  Have you been able to return to your normal activities? yes  Do you have any questions about your discharge instructions: Diet   no Medications  no Follow up visit  no  Do you have questions or concerns about your Care? no  Actions: * If pain score is 4 or above: No action needed, pain <4. Patient stated felt much better this morning. Denies any discomfort or pain now. Ate just a little yesterday but tolerated without any problems. Instructed to call if any questions or concerns latter.

## 2011-09-03 ENCOUNTER — Encounter: Payer: Self-pay | Admitting: Gastroenterology

## 2011-09-05 ENCOUNTER — Other Ambulatory Visit: Payer: Self-pay | Admitting: Obstetrics and Gynecology

## 2011-09-05 DIAGNOSIS — Z1231 Encounter for screening mammogram for malignant neoplasm of breast: Secondary | ICD-10-CM

## 2011-09-12 ENCOUNTER — Ambulatory Visit (INDEPENDENT_AMBULATORY_CARE_PROVIDER_SITE_OTHER): Payer: BC Managed Care – PPO | Admitting: Gastroenterology

## 2011-09-12 ENCOUNTER — Telehealth: Payer: Self-pay | Admitting: *Deleted

## 2011-09-12 ENCOUNTER — Other Ambulatory Visit (INDEPENDENT_AMBULATORY_CARE_PROVIDER_SITE_OTHER): Payer: BC Managed Care – PPO

## 2011-09-12 ENCOUNTER — Encounter: Payer: Self-pay | Admitting: Gastroenterology

## 2011-09-12 VITALS — BP 100/64 | HR 72 | Ht 63.0 in | Wt 105.0 lb

## 2011-09-12 DIAGNOSIS — K5190 Ulcerative colitis, unspecified, without complications: Secondary | ICD-10-CM

## 2011-09-12 LAB — CBC WITH DIFFERENTIAL/PLATELET
Basophils Absolute: 0 10*3/uL (ref 0.0–0.1)
Eosinophils Absolute: 0 10*3/uL (ref 0.0–0.7)
Lymphocytes Relative: 26.5 % (ref 12.0–46.0)
MCHC: 34.4 g/dL (ref 30.0–36.0)
Monocytes Relative: 5.6 % (ref 3.0–12.0)
Neutro Abs: 3 10*3/uL (ref 1.4–7.7)
Neutrophils Relative %: 66.7 % (ref 43.0–77.0)
Platelets: 315 10*3/uL (ref 150.0–400.0)
RDW: 12.8 % (ref 11.5–14.6)

## 2011-09-12 MED ORDER — MERCAPTOPURINE 50 MG PO TABS: 50.0000 mg | ORAL_TABLET | Freq: Every day | ORAL | Status: DC

## 2011-09-12 NOTE — Progress Notes (Signed)
History of Present Illness: This is a 55 year old Caucasian female with recurrent ulcerative colitis. She recently had a flare after stopping 6-MP which she had been on for several years and had been in remission. She had a rather quick flare, and colonoscopy showed rather marked proctitis. She currently is asymptomatic on 6-MP 50 mg a day and Canasa 1 g suppositories at bedtime. She specifically denies abdominal pain, diarrhea, rectal bleeding, or systemic complaints.    Current Medications, Allergies, Past Medical History, Past Surgical History, Family History and Social History were reviewed in [employer] record.   Assessment and left-sided ulcerative colitis in remission on resuming immunosuppressive therapy. We will continue her 6-MP 50 mg a day, check CBC today and again in 6 months. She is to taper off of her Canasa suppositories as tolerated. Encounter Diagnosis  Name Primary?  . Ulcerative colitis, unspecified Yes

## 2011-09-12 NOTE — Telephone Encounter (Signed)
Left message labs good make sure to get labs done in june

## 2011-09-12 NOTE — Telephone Encounter (Signed)
Message copied by Sheral Flow on Thu Sep 12, 2011  2:07 PM ------      Message from: Sharlett Iles, DAVID R      Created: Thu Sep 12, 2011  1:50 PM       All ok...please call her

## 2011-09-12 NOTE — Patient Instructions (Addendum)
Please go to the basement today for your labs.  You will need a cbc in 6 months come to the basement around 03/02/2012.  Make appt to see Dr Sharlett Iles in one year.  Taper off your supp use every other night

## 2011-10-11 ENCOUNTER — Ambulatory Visit (HOSPITAL_COMMUNITY): Payer: BC Managed Care – PPO

## 2011-11-20 ENCOUNTER — Ambulatory Visit (HOSPITAL_COMMUNITY)
Admission: RE | Admit: 2011-11-20 | Discharge: 2011-11-20 | Disposition: A | Discharge status: Home / Self Care | Payer: 59 | Source: Ambulatory Visit | Attending: Obstetrics and Gynecology | Admitting: Obstetrics and Gynecology

## 2011-11-20 DIAGNOSIS — Z1231 Encounter for screening mammogram for malignant neoplasm of breast: Secondary | ICD-10-CM | POA: Insufficient documentation

## 2012-02-10 ENCOUNTER — Other Ambulatory Visit: Payer: Self-pay | Admitting: Gastroenterology

## 2012-02-10 ENCOUNTER — Encounter: Payer: Self-pay | Admitting: General Practice

## 2012-02-10 MED ORDER — CYANOCOBALAMIN 500 MCG/0.1ML NA SOLN: NASAL | Status: DC

## 2012-02-10 NOTE — Telephone Encounter (Signed)
Error

## 2012-02-10 NOTE — Telephone Encounter (Signed)
rx sent, pt aware she needs ov and labs in June

## 2012-02-13 ENCOUNTER — Other Ambulatory Visit: Payer: Self-pay | Admitting: Gastroenterology

## 2012-03-16 ENCOUNTER — Telehealth: Payer: Self-pay | Admitting: *Deleted

## 2012-03-16 ENCOUNTER — Other Ambulatory Visit (INDEPENDENT_AMBULATORY_CARE_PROVIDER_SITE_OTHER): Payer: 59

## 2012-03-16 ENCOUNTER — Other Ambulatory Visit: Payer: Self-pay | Admitting: *Deleted

## 2012-03-16 DIAGNOSIS — K529 Noninfective gastroenteritis and colitis, unspecified: Secondary | ICD-10-CM

## 2012-03-16 DIAGNOSIS — K5190 Ulcerative colitis, unspecified, without complications: Secondary | ICD-10-CM

## 2012-03-16 DIAGNOSIS — K5289 Other specified noninfective gastroenteritis and colitis: Secondary | ICD-10-CM

## 2012-03-16 LAB — CBC WITH DIFFERENTIAL/PLATELET
Basophils Absolute: 0 10*3/uL (ref 0.0–0.1)
Eosinophils Relative: 0.6 % (ref 0.0–5.0)
HCT: 37.8 % (ref 36.0–46.0)
Hemoglobin: 12.8 g/dL (ref 12.0–15.0)
Lymphocytes Relative: 24.8 % (ref 12.0–46.0)
Monocytes Relative: 7.2 % (ref 3.0–12.0)
Neutro Abs: 3.9 10*3/uL (ref 1.4–7.7)
RBC: 3.68 Mil/uL — ABNORMAL LOW (ref 3.87–5.11)
RDW: 13.4 % (ref 11.5–14.6)
WBC: 5.8 10*3/uL (ref 4.5–10.5)

## 2012-03-16 NOTE — Telephone Encounter (Signed)
Message copied by Larina Bras on Mon Mar 16, 2012  4:22 PM ------      Message from: Sharlett Iles, DAVID R      Created: Mon Mar 16, 2012  4:12 PM       Same meds...repeat cbc and liver enzymes in 6 mos

## 2012-03-16 NOTE — Telephone Encounter (Signed)
Advised patient that she should continue the same medications and have repeat blood tests in 6 months. Patient verbalizes understanding.

## 2012-03-17 ENCOUNTER — Ambulatory Visit (INDEPENDENT_AMBULATORY_CARE_PROVIDER_SITE_OTHER): Payer: 59 | Admitting: Gastroenterology

## 2012-03-17 ENCOUNTER — Encounter: Payer: Self-pay | Admitting: Gastroenterology

## 2012-03-17 ENCOUNTER — Other Ambulatory Visit (INDEPENDENT_AMBULATORY_CARE_PROVIDER_SITE_OTHER): Payer: 59

## 2012-03-17 VITALS — BP 110/60 | HR 72 | Ht 63.0 in | Wt 104.0 lb

## 2012-03-17 DIAGNOSIS — Z9889 Other specified postprocedural states: Secondary | ICD-10-CM

## 2012-03-17 DIAGNOSIS — K5120 Ulcerative (chronic) proctitis without complications: Secondary | ICD-10-CM

## 2012-03-17 DIAGNOSIS — K589 Irritable bowel syndrome without diarrhea: Secondary | ICD-10-CM

## 2012-03-17 DIAGNOSIS — E892 Postprocedural hypoparathyroidism: Secondary | ICD-10-CM

## 2012-03-17 LAB — ELECTROLYTE PANEL: Sodium: 141 mEq/L (ref 135–145)

## 2012-03-17 LAB — HEPATIC FUNCTION PANEL
ALT: 22 U/L (ref 0–35)
AST: 20 U/L (ref 0–37)
Bilirubin, Direct: 0.1 mg/dL (ref 0.0–0.3)
Total Bilirubin: 0.9 mg/dL (ref 0.3–1.2)

## 2012-03-17 MED ORDER — CYANOCOBALAMIN 500 MCG/0.1ML NA SOLN: NASAL | Status: DC

## 2012-03-17 MED ORDER — HYOSCYAMINE-PHENYLTOLOXAMINE 0.0625-15 MG PO CAPS: 1.0000 | ORAL_CAPSULE | Freq: Two times a day (BID) | ORAL | Status: DC | PRN

## 2012-03-17 NOTE — Patient Instructions (Addendum)
Your physician has requested that you go to the basement for lab work before leaving today. We have given you samples of the following medication to take: Digex 1 tablet twice daily as needed Please use your canasa suppositories every other night. We have sent the following medications to your pharmacy for you: Nascabol CC: Dr Shirline Frees

## 2012-03-17 NOTE — Progress Notes (Signed)
This is a 56 year old Caucasian female with chronic left sided ulcerative colitis. Attempts to discontinue her chronic 6-MP therapy resulted in a flare of her disease in November confirmed by colonoscopy. Her 6-MP was reinstitution, and she was placed on Canasa 1 g suppositories with good response. Patient does have some abdominal cramping but denies diarrhea or at this point. Her main concern is periodic bright red blood perrectum of a minor degree without rectal discomfort or drainage. The patient is status post parathyroidectomy for hyperparathyroidism, and apparently does not have endocrine followup at this time. She is on vitamin D replacement, B12 replacement, Premarin, and also takes Prozac 10 mg a day, Topamax 100 mg a day, and when necessary Zomig. She denies general medical, cardiovascular, pulmonary, genitourinary symptomatology. She recently has become married and changed her name. She denies any increase in her stress levels.  Current Medications, Allergies, Past Medical History, Past Surgical History, Family History and Social History were reviewed in [employer] record.  Pertinent Review of Systems Negative   Physical Exam: Very thin but healthy-appearing patient in no distress. Blood pressure 110/60, pulse 72 and regular, weight 104 pounds with a BMI of 18.42. I cannot appreciate stigmata of chronic liver disease. Chest is clear and she appears to be in a regular rhythm without murmurs gallops or rubs. She has a very flat abdomen with how organomegaly, tenderness, but I can't palpate some lower nominal stool. Inspection of the rectum is unremarkable as is rectal exam. Stool was not bloody but is trace guaiac positive. Mental status is normal.    Assessment and Plan: Chronic left sided ulcerative colitis with recent colonoscopy showing mostly proctitis. I have asked her to try Canasa 1 g suppositories over regular basis every other day while continuing 6-MP 50 mg a  day. Recent CBC showed no significant changes to change her immunosuppressive doses. I have ordered liver function tests, PTH, calcium levels, and renal function tests. I also have given her samples of Digex to try every 12 hours as tolerated for her abdominal cramping. Side effects were reviewed including dry mouth, urinary hesitancy, and blurred vision with adjusted doses as needed. She may need repeat endocrine evaluation.   Please copy her primary care physician, referring physician, and pertinent subspecialists. Encounter Diagnosis  Name Primary?  . Ulcerative proctitis Yes

## 2012-03-18 LAB — PARATHYROID HORMONE, INTACT (NO CA): PTH: 96.5 pg/mL — ABNORMAL HIGH (ref 14.0–72.0)

## 2012-03-19 ENCOUNTER — Other Ambulatory Visit: Payer: Self-pay | Admitting: *Deleted

## 2012-03-19 DIAGNOSIS — D351 Benign neoplasm of parathyroid gland: Secondary | ICD-10-CM

## 2012-03-19 DIAGNOSIS — Z8639 Personal history of other endocrine, nutritional and metabolic disease: Secondary | ICD-10-CM

## 2012-03-24 ENCOUNTER — Telehealth: Payer: Self-pay | Admitting: Gastroenterology

## 2012-03-24 NOTE — Telephone Encounter (Signed)
Can stop B12

## 2012-03-24 NOTE — Telephone Encounter (Signed)
lmom for pt to call back. Per Dr Sharlett Iles, she can stop the B12.

## 2012-03-24 NOTE — Telephone Encounter (Signed)
Pt reports she lot her job and had already ordered a 3 month supply of Nascobol which cost >$900. Pt wants to know if she has to continue it Vit B12? Thanks.

## 2012-04-01 ENCOUNTER — Ambulatory Visit: Payer: 59 | Admitting: Endocrinology

## 2012-05-04 ENCOUNTER — Telehealth: Payer: Self-pay | Admitting: Gastroenterology

## 2012-05-04 MED ORDER — MERCAPTOPURINE 50 MG PO TABS: 50.0000 mg | ORAL_TABLET | Freq: Every day | ORAL | Status: DC

## 2012-05-04 NOTE — Telephone Encounter (Signed)
rx sent

## 2012-09-18 ENCOUNTER — Telehealth: Payer: Self-pay | Admitting: Gastroenterology

## 2012-09-18 NOTE — Telephone Encounter (Signed)
lmom for pt to call back

## 2012-09-18 NOTE — Telephone Encounter (Signed)
Dr Sharlett Iles, informed pt she can wait until January 2014 to have her labs done; she will go under a new policy then. She did see a Tax adviser at [facility] for her elevated Calcium; see under Media tab. She was dx with [facility] and was told her calcium and PTH levels would always be abnormal. She reports her old insurance would not pay for B12 levels, but the new policy will. Can we order B12 levels on her; other labs ordered at CBC and Hepatic Panel; on 6MP? Thanks.

## 2012-09-18 NOTE — Telephone Encounter (Signed)
Pt will see Dr Sharlett Iles on 10/13/12.

## 2012-09-18 NOTE — Telephone Encounter (Signed)
Yes the labs are okay but she does need off this appointment followup.

## 2012-10-05 ENCOUNTER — Telehealth: Payer: Self-pay | Admitting: *Deleted

## 2012-10-05 ENCOUNTER — Other Ambulatory Visit (INDEPENDENT_AMBULATORY_CARE_PROVIDER_SITE_OTHER): Payer: 59

## 2012-10-05 DIAGNOSIS — K5190 Ulcerative colitis, unspecified, without complications: Secondary | ICD-10-CM

## 2012-10-05 LAB — HEPATIC FUNCTION PANEL
ALT: 18 U/L (ref 0–35)
AST: 17 U/L (ref 0–37)
Albumin: 4 g/dL (ref 3.5–5.2)

## 2012-10-05 LAB — CBC WITH DIFFERENTIAL/PLATELET
Eosinophils Relative: 0.3 % (ref 0.0–5.0)
HCT: 39.4 % (ref 36.0–46.0)
Hemoglobin: 13.5 g/dL (ref 12.0–15.0)
Lymphs Abs: 1.3 10*3/uL (ref 0.7–4.0)
MCV: 101.3 fl — ABNORMAL HIGH (ref 78.0–100.0)
Monocytes Relative: 7.4 % (ref 3.0–12.0)
Neutro Abs: 3.2 10*3/uL (ref 1.4–7.7)
RDW: 13.1 % (ref 11.5–14.6)
WBC: 4.8 10*3/uL (ref 4.5–10.5)

## 2012-10-05 NOTE — Telephone Encounter (Signed)
Patient has still not come for labs despite phone calls reminding her to have them completed. I will send a letter via mail for her.

## 2012-10-05 NOTE — Telephone Encounter (Signed)
Message copied by Larina Bras on Mon Oct 05, 2012  8:32 AM ------      Message from: Larina Bras      Created: Fri Sep 25, 2012  8:07 AM       Left message for patient again to have labs....            ----- Message -----         From: Larina Bras, CMA         Sent: 09/25/2012           To: Larina Bras, CMA                        ----- Message -----         From: Larina Bras, CMA         Sent: 09/18/2012           To: Larina Bras, CMA            Did patient get labs? I reminded her...      ----- Message -----         From: Larina Bras, CMA         Sent: 09/08/2012           To: Larina Bras, CMA                        ----- Message -----         From: Larina Bras, CMA         Sent: 08/31/2012           To: Larina Bras, CMA            Pt to have cbc and hepatic panel around 09/15/12. Call and remind her ([facility] pt). Labs already in epic

## 2012-10-06 ENCOUNTER — Encounter: Payer: Self-pay | Admitting: *Deleted

## 2012-10-07 ENCOUNTER — Other Ambulatory Visit: Payer: Self-pay | Admitting: Gastroenterology

## 2012-10-07 DIAGNOSIS — K5190 Ulcerative colitis, unspecified, without complications: Secondary | ICD-10-CM

## 2012-10-13 ENCOUNTER — Ambulatory Visit (INDEPENDENT_AMBULATORY_CARE_PROVIDER_SITE_OTHER): Payer: BC Managed Care – PPO | Admitting: Gastroenterology

## 2012-10-13 ENCOUNTER — Encounter: Payer: Self-pay | Admitting: Gastroenterology

## 2012-10-13 VITALS — BP 120/76 | HR 66 | Ht 62.75 in | Wt 106.0 lb

## 2012-10-13 DIAGNOSIS — K5289 Other specified noninfective gastroenteritis and colitis: Secondary | ICD-10-CM

## 2012-10-13 DIAGNOSIS — K529 Noninfective gastroenteritis and colitis, unspecified: Secondary | ICD-10-CM

## 2012-10-13 MED ORDER — MESALAMINE 1.2 G PO TBEC: 1.2000 g | DELAYED_RELEASE_TABLET | Freq: Every day | ORAL | Status: DC

## 2012-10-13 MED ORDER — MERCAPTOPURINE 50 MG PO TABS: 50.0000 mg | ORAL_TABLET | Freq: Every day | ORAL | Status: DC

## 2012-10-13 MED ORDER — CYANOCOBALAMIN 500 MCG/0.1ML NA SOLN: NASAL | Status: DC

## 2012-10-13 MED ORDER — CYANOCOBALAMIN 1000 MCG/ML IJ SOLN
1000.0000 ug | INTRAMUSCULAR | Status: DC
  Administered 2012-10-13: 1000 ug via INTRAMUSCULAR

## 2012-10-13 MED ORDER — CYANOCOBALAMIN 1000 MCG/ML IJ SOLN: 1000.0000 ug | INTRAMUSCULAR | Status: DC

## 2012-10-13 NOTE — Progress Notes (Signed)
This is a 57 year old Caucasian female with chronic left sided ulcerative colitis.  She has been in remission for many years on 6-MP 50 mg a day.  Attempts one year ago to stop this medication resulted in a relapse of her colitis.  She now is in remission having regular bowel movements, and takes 6-MP 50 mg a day.  She also uses Canasa 1 g suppositories at bedtime.  She denies rectal bleeding but does have excessive mucus in her stool.  Other problems include hypercalcemia apparently related to a [facility]  She has had previous surgery for parathyroid issues.  The patient denies upper GI or hepatobiliary complaints, anorexia, weight loss, or other systemic symptoms.  She is on Prozac 10 mg a day and Topamax 50 mg a day..  Current Medications, Allergies, Past Medical History, Past Surgical History, Family History and Social History were reviewed in [employer] record.  Pertinent Review of Systems Negative   Physical Exam: Healthy appearing patient in no distress.  Blood pressure 120/76, pulse 66 and regular, and weight under and 6 pounds the BMI of 18.93.  I cannot appreciate stigmata of chronic liver disease.  Abdominal exam shows no organomegaly, masses or tenderness.  Bowel sounds are normal.  Mental status is normal.    Assessment and Plan: Lab data from January 6 shows normal CBC and metabolic profile.  WBC is stable at 4800.  She has macrocytosis related to her medications.  I have added Lialda 2.4 g a day to her regime to see if this helps some of her rectal issues.  She is to continue other medications as listed and reviewed.  She is to continue with every 6 months CBC and liver profile.  She appears to be in good remission from her disease at this time, and is up-to-date on her colonoscopy exams.  Her hypercalcemia and hyperparathyroidism is followed at another medical practice. No diagnosis found.

## 2012-10-13 NOTE — Patient Instructions (Addendum)
We have given you samples of Lialda today, you are to take 2 once daily We will continue giving you your monthly b12 injections You received a B12 injection today

## 2012-11-06 ENCOUNTER — Telehealth: Payer: Self-pay | Admitting: Gastroenterology

## 2012-11-06 NOTE — Telephone Encounter (Signed)
Stop lialda and use Canasa supp qhs prn,continue 6 MP

## 2012-11-06 NOTE — Telephone Encounter (Signed)
Informed pt of Dr Buel Ream instructions. She stated understanding. She states her insurance will not pay for nasal B12. She has a vial of Vit B12 and syringes and will call next week to schedule an appt to instruct on self injections.

## 2012-11-06 NOTE — Telephone Encounter (Signed)
Patient has had nausea every since she started on the samples we gave her.  Would like to speak to you about this.

## 2012-11-06 NOTE — Telephone Encounter (Signed)
Pt seen 10/13/12; UC in remission, but she had problems with excessive mucus and used Canasa. At [facility] was d/c and pt was placed on Lialda 2.4 grams daily. Pt reports slight improvement with Lialda, but she has been nauseated. ( that is a side effect per drug book).  Instructed her to stop the Lialda over the weekend to see if the nausea is better, but should she restart the Canasa; states she's still having issues? Dr Sharlett Iles, I do not see in notes where she was to stop the Canasa? Please advise. Thanks.

## 2012-11-30 ENCOUNTER — Other Ambulatory Visit: Payer: Self-pay | Admitting: Gastroenterology

## 2012-12-01 ENCOUNTER — Telehealth: Payer: Self-pay | Admitting: Gastroenterology

## 2012-12-01 NOTE — Telephone Encounter (Signed)
I called patient back and explained that there is a Producer, television/film/video on the B12 injection.  I told patient I see she has Nascobal on her medication list. Patient said it was to expensive for her to get because her insurance will not cover it.  Patient said that her next injection is not due to end of March. I told patient to call around that time because Im not sure how long the shortage will be.  Patient verbalized understanding.

## 2013-01-08 ENCOUNTER — Telehealth: Payer: Self-pay | Admitting: Gastroenterology

## 2013-01-08 MED ORDER — MESALAMINE 1000 MG RE SUPP: 1000.0000 mg | Freq: Every evening | RECTAL | Status: DC | PRN

## 2013-01-08 NOTE — Telephone Encounter (Signed)
Medication sent.

## 2013-01-08 NOTE — Telephone Encounter (Signed)
RX was sent to [facility].. I called patient back and told her prescription was sent to [facility]. Patient stated prescription is $100 and wanted to know if there is anything else she can have. Per Caren Griffins, RN Georgiann Mccoy is all they have and Dr. Sharlett Iles per phone note below wanted patient taking Canasa.  (Stop lialda and use Canasa supp qhs prn,continue 6 MP)  I explained this to patient and she said that she will call [facility] and check their price. Told patient to call back if there is any problems. Patient verbalized understanding.

## 2013-02-05 ENCOUNTER — Encounter: Payer: Self-pay | Admitting: Gastroenterology

## 2013-02-19 ENCOUNTER — Other Ambulatory Visit: Payer: Self-pay | Admitting: Obstetrics and Gynecology

## 2013-02-19 DIAGNOSIS — Z1231 Encounter for screening mammogram for malignant neoplasm of breast: Secondary | ICD-10-CM

## 2013-03-01 ENCOUNTER — Ambulatory Visit (HOSPITAL_COMMUNITY)
Admission: RE | Admit: 2013-03-01 | Discharge: 2013-03-01 | Disposition: A | Discharge status: Home / Self Care | Payer: BC Managed Care – PPO | Source: Ambulatory Visit | Attending: Obstetrics and Gynecology | Admitting: Obstetrics and Gynecology

## 2013-03-01 DIAGNOSIS — Z1231 Encounter for screening mammogram for malignant neoplasm of breast: Secondary | ICD-10-CM

## 2013-03-25 ENCOUNTER — Encounter: Payer: Self-pay | Admitting: Obstetrics and Gynecology

## 2013-03-31 ENCOUNTER — Ambulatory Visit: Payer: Self-pay | Admitting: Obstetrics and Gynecology

## 2013-04-18 IMAGING — CR DG ABDOMEN ACUTE W/ 1V CHEST
3 series · 3 of 3 positions shown · non-contrast
Comparison: None.

CLINICAL DATA: Rule out free air after colonoscopy.

ACUTE ABDOMEN SERIES (ABDOMEN 2 VIEW & CHEST 1 VIEW)

[w chest pa]
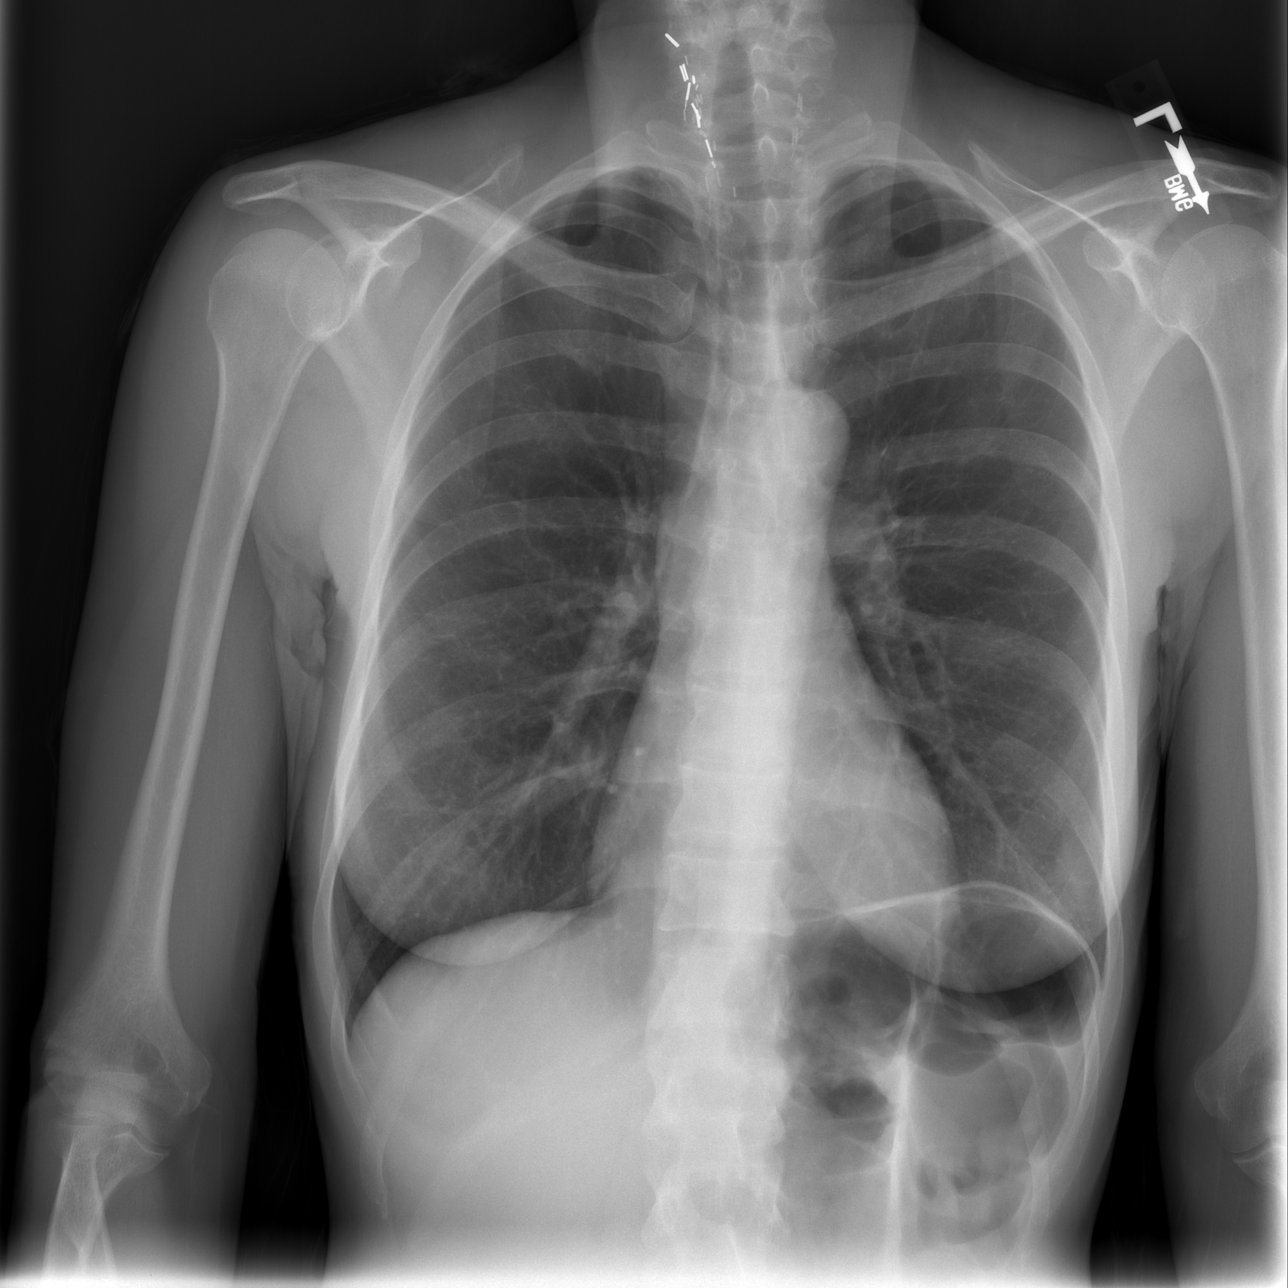

[w abdomen upright *]
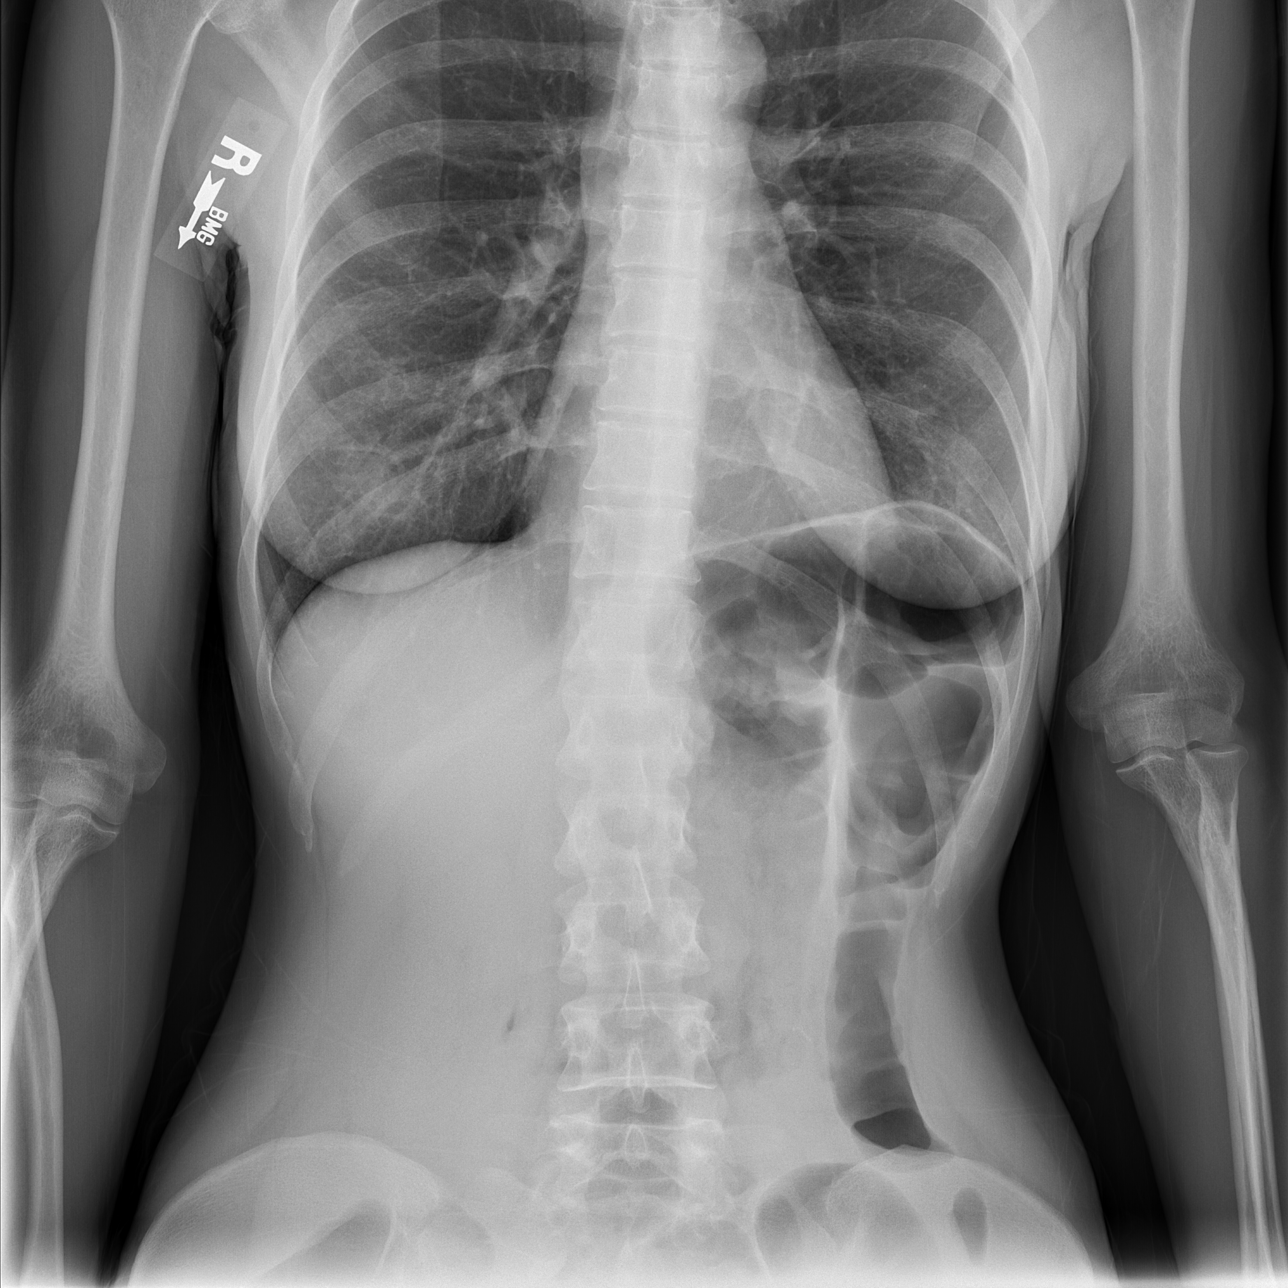

[t abdomen supine]
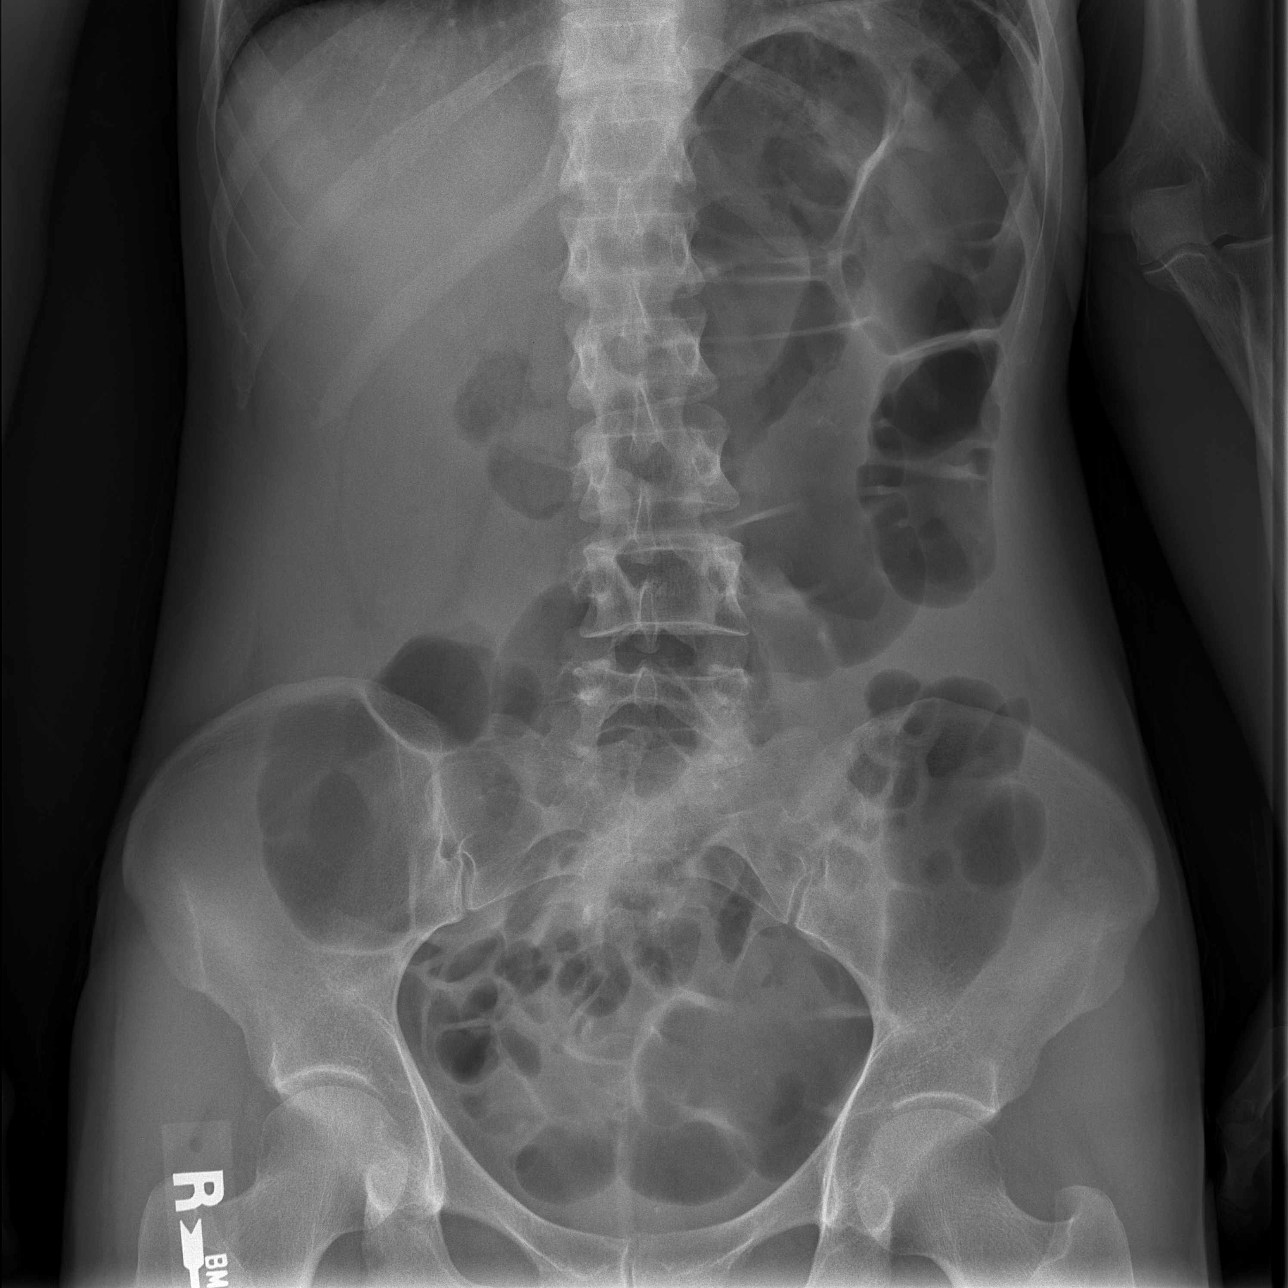

[3 of 3 positions shown; findings below may reference images not displayed]

FINDINGS: Chest radiograph demonstrates clear lungs. Heart and
mediastinum are within normal limits.  No evidence for a
pneumothorax.  Trachea is midline.  Surgical clips in the lower
right neck.  There is no evidence to suggest free intraperitoneal
air.  There is gas within the small and large bowel.
Nonobstructive bowel gas pattern.
IMPRESSION: No evidence for free air.

Nonobstructive bowel gas pattern.

No acute chest findings.

## 2013-04-26 ENCOUNTER — Ambulatory Visit (INDEPENDENT_AMBULATORY_CARE_PROVIDER_SITE_OTHER): Payer: BC Managed Care – PPO | Admitting: Gastroenterology

## 2013-04-26 ENCOUNTER — Other Ambulatory Visit (INDEPENDENT_AMBULATORY_CARE_PROVIDER_SITE_OTHER): Payer: BC Managed Care – PPO

## 2013-04-26 ENCOUNTER — Encounter: Payer: Self-pay | Admitting: Gastroenterology

## 2013-04-26 VITALS — BP 110/70 | HR 75 | Ht 62.75 in | Wt 103.4 lb

## 2013-04-26 DIAGNOSIS — Z9225 Personal history of immunosupression therapy: Secondary | ICD-10-CM

## 2013-04-26 DIAGNOSIS — F3289 Other specified depressive episodes: Secondary | ICD-10-CM

## 2013-04-26 DIAGNOSIS — F329 Major depressive disorder, single episode, unspecified: Secondary | ICD-10-CM

## 2013-04-26 DIAGNOSIS — R198 Other specified symptoms and signs involving the digestive system and abdomen: Secondary | ICD-10-CM

## 2013-04-26 DIAGNOSIS — Z8639 Personal history of other endocrine, nutritional and metabolic disease: Secondary | ICD-10-CM

## 2013-04-26 DIAGNOSIS — F32A Depression, unspecified: Secondary | ICD-10-CM

## 2013-04-26 DIAGNOSIS — D649 Anemia, unspecified: Secondary | ICD-10-CM

## 2013-04-26 DIAGNOSIS — K5190 Ulcerative colitis, unspecified, without complications: Secondary | ICD-10-CM

## 2013-04-26 DIAGNOSIS — Z862 Personal history of diseases of the blood and blood-forming organs and certain disorders involving the immune mechanism: Secondary | ICD-10-CM

## 2013-04-26 LAB — HEPATIC FUNCTION PANEL
ALT: 30 U/L (ref 0–35)
AST: 23 U/L (ref 0–37)
Bilirubin, Direct: 0.2 mg/dL (ref 0.0–0.3)
Total Bilirubin: 0.9 mg/dL (ref 0.3–1.2)

## 2013-04-26 LAB — BASIC METABOLIC PANEL
BUN: 13 mg/dL (ref 6–23)
Calcium: 11.3 mg/dL — ABNORMAL HIGH (ref 8.4–10.5)
GFR: 98.31 mL/min (ref >60.00)
Glucose, Bld: 99 mg/dL (ref 70–99)
Sodium: 139 mEq/L (ref 135–145)

## 2013-04-26 LAB — CBC WITH DIFFERENTIAL/PLATELET
Basophils Absolute: 0 10*3/uL (ref 0.0–0.1)
Lymphocytes Relative: 24.3 % (ref 12.0–46.0)
Monocytes Relative: 7.5 % (ref 3.0–12.0)
Platelets: 298 10*3/uL (ref 150.0–400.0)
RDW: 14.5 % (ref 11.5–14.6)

## 2013-04-26 LAB — HEPATITIS B SURFACE ANTIBODY,QUALITATIVE: Hep B S Ab: NEGATIVE

## 2013-04-26 LAB — VITAMIN B12: Vitamin B-12: >1500 pg/mL — ABNORMAL HIGH (ref 211–911)

## 2013-04-26 MED ORDER — NA SULFATE-K SULFATE-MG SULF 17.5-3.13-1.6 GM/177ML PO SOLN: ORAL | Status: DC

## 2013-04-26 NOTE — Patient Instructions (Addendum)
  You have been scheduled for a colonoscopy with propofol. Please follow written instructions given to you at your visit today.  Please pick up your prep kit at the pharmacy within the next 1-3 days. If you use inhalers (even only as needed), please bring them with you on the day of your procedure. Your physician has requested that you go to www.startemmi.com and enter the access code given to you at your visit today. This web site gives a general overview about your procedure. However, you should still follow specific instructions given to you by our office regarding your preparation for the procedure.  We have given you a TB skin test today. Please make certain to come back to the office for a reading between 48-72 hours from now to avoid requiring repeat testing.  Your physician has requested that you go to the basement for the following lab work before leaving today: Anemia Panel CRP Sedimentation Rate  CBC TSH Liver Function Panel BMP Hepatitis B Antibody Hepatitis B Antigen  Hepatitis A  Antibody Total  Thiopurine Metabolites C-Diff stool study ___________________________________________                                               We are excited to introduce MyChart, a new best-in-class service that provides you online access to important information in your electronic medical record. We want to make it easier for you to view your health information - all in one secure location - when and where you need it. We expect MyChart will enhance the quality of care and service we provide.  When you register for MyChart, you can:    View your test results.    Request appointments and receive appointment reminders via email.    Request medication renewals.    View your medical history, allergies, medications and immunizations.    Communicate with your physician's office through a password-protected site.    Conveniently print information such as your medication lists.  To find  out if MyChart is right for you, please talk to a member of our clinical staff today. We will gladly answer your questions about this free health and wellness tool.  If you are age 52 or older and want a member of your family to have access to your record, you must provide written consent by completing a proxy form available at our office. Please speak to our clinical staff about guidelines regarding accounts for patients younger than age 63.  As you activate your MyChart account and need any technical assistance, please call the MyChart technical support line at [phone]) or email your question to [email or web address]. If you email your question(s), please include your name, a return phone number and the best time to reach you.  If you have non-urgent health-related questions, you can send a message to our office through [place] at [email or web address]. If you have a medical emergency, call 911.  Thank you for using MyChart as your new health and wellness resource!   MyChart licensed from [employer],  1999-2010. Patents Pending.

## 2013-04-26 NOTE — Progress Notes (Signed)
This is a 57 year old Caucasian female who has long-term ulcerative colitis, mostly left-sided colitis.  She is now having tenesmus and bright red blood per rectum with 4-5 loose stools a day despite 6-MP 50 mg a day, Lialda 2.4 g a day, and cannot so 1 g suppositories at bedtime.  She in the past been in remission for many years on 6-MP, and previously her colitis flared when this was discontinued.  She has not had the usual response to reinstitution of 6-MP.  She has no incontinency, abdominal pain, upper GI or hepatobiliary or systemic complaints.  She denies any cardiopulmonary complaints at this time.  She passes had surgery for parathyroid adenomas, as had no subsequent problems with this illness.  Current Medications, Allergies, Past Medical History, Past Surgical History, Family History and Social History were reviewed in [employer] record.  ROS: All systems were reviewed and are negative unless otherwise stated in the HPI.          Physical Exam: Blood pressure 110/70, pulse 75 and weight 103 with a BMI of 18.46.  Her abdomen shows no organomegaly, masses or tenderness.  Bowel sounds are normal.  Rectal exam is deferred.  Chest is clear, she's a regular rhythm without murmurs gallops or rubs.  Peripheral extremities are unremarkable and mental status is normal.    Assessment and Plan: Left sided ulcerative colitis unresponsive to reinstitution of 6-MP and oral and topical aminosalicylate therapy.  We will check followup lab data, 6-MP metabolite levels, sedimentation rate, CRP, and repeat her colonoscopy.  I anticipate that she will probably need institution of Remicade or Humira and have ordered PPD skin testing, also hepatitis A and B. serologic testing.  For now we will continue medications as listed and reviewed.  This patient does not want any corticosteroid therapy considerations.  Also stool for C. difficile toxin exam ordered she is mildly depressed today and  have asked her continue her Prozac 10 mg a day, and we may need psychological counseling.  She has an appointment to see Dr. Elyse Hsu gynecologic evaluation in August.  Please copy her a copy of this note.

## 2013-04-27 ENCOUNTER — Telehealth: Payer: Self-pay | Admitting: Gastroenterology

## 2013-04-27 ENCOUNTER — Other Ambulatory Visit: Payer: BC Managed Care – PPO

## 2013-04-27 ENCOUNTER — Encounter: Payer: Self-pay | Admitting: Gastroenterology

## 2013-04-27 DIAGNOSIS — Z9225 Personal history of immunosupression therapy: Secondary | ICD-10-CM

## 2013-04-27 DIAGNOSIS — Z8639 Personal history of other endocrine, nutritional and metabolic disease: Secondary | ICD-10-CM

## 2013-04-27 DIAGNOSIS — F329 Major depressive disorder, single episode, unspecified: Secondary | ICD-10-CM

## 2013-04-27 DIAGNOSIS — R198 Other specified symptoms and signs involving the digestive system and abdomen: Secondary | ICD-10-CM

## 2013-04-27 DIAGNOSIS — D649 Anemia, unspecified: Secondary | ICD-10-CM

## 2013-04-27 DIAGNOSIS — F32A Depression, unspecified: Secondary | ICD-10-CM

## 2013-04-27 DIAGNOSIS — K5190 Ulcerative colitis, unspecified, without complications: Secondary | ICD-10-CM

## 2013-04-27 LAB — IBC PANEL
Iron: 152 ug/dL — ABNORMAL HIGH (ref 42–145)
Transferrin: 224.5 mg/dL (ref 212.0–360.0)

## 2013-04-27 NOTE — Telephone Encounter (Signed)
Patient back to soon to have tb read

## 2013-04-28 ENCOUNTER — Encounter: Payer: Self-pay | Admitting: Gastroenterology

## 2013-04-28 ENCOUNTER — Ambulatory Visit (AMBULATORY_SURGERY_CENTER): Payer: BC Managed Care – PPO | Admitting: Gastroenterology

## 2013-04-28 VITALS — BP 126/73 | HR 63 | Temp 97.8°F | Resp 16 | Ht 62.75 in | Wt 103.0 lb

## 2013-04-28 DIAGNOSIS — Z1211 Encounter for screening for malignant neoplasm of colon: Secondary | ICD-10-CM

## 2013-04-28 DIAGNOSIS — K5190 Ulcerative colitis, unspecified, without complications: Secondary | ICD-10-CM

## 2013-04-28 DIAGNOSIS — K5289 Other specified noninfective gastroenteritis and colitis: Secondary | ICD-10-CM

## 2013-04-28 DIAGNOSIS — D126 Benign neoplasm of colon, unspecified: Secondary | ICD-10-CM

## 2013-04-28 DIAGNOSIS — K625 Hemorrhage of anus and rectum: Secondary | ICD-10-CM

## 2013-04-28 LAB — TB SKIN TEST: Induration: 0 mm

## 2013-04-28 MED ORDER — SODIUM CHLORIDE 0.9 % IV SOLN: 500.0000 mL | INTRAVENOUS | Status: DC

## 2013-04-28 NOTE — Op Note (Signed)
[facility]  [address] [place], [id number]   COLONOSCOPY PROCEDURE REPORT  PATIENT: Ellen Parks, Ellen Parks  MR#: #[id number] BIRTHDATE: 10/17/1955 , 24  yrs. old GENDER: Female ENDOSCOPIST: Sable Feil, MD, [facility] REFERRED BY: PROCEDURE DATE:  04/28/2013 PROCEDURE:   Colonoscopy with biopsy ASA CLASS:   Class II INDICATIONS:Rectal Bleeding. MEDICATIONS: propofol (Diprivan) 320m IV  DESCRIPTION OF PROCEDURE:   After the risks benefits and alternatives of the procedure were thoroughly explained, informed consent was obtained.  A digital rectal exam revealed no abnormalities of the rectum.   The LB [id number] endoscope was introduced through the anus and advanced to the cecum, which was identified by both the appendix and ileocecal valve. No adverse events experienced.   The quality of the prep was excellent, using MoviPrep  The instrument was then slowly withdrawn as the colon was fully examined.    the colonoscope was advanced into the cecum. Pseudopolyps were noted in the right colon and biopsies were done..  Colonic mucosa was essentially normal until the distal rectum.  There is mild proctitis from 0-7 cm.  Pictures of mucosa biopsies were obtained and there also a few scattered pseudopolyps and left colon.The time to cecum=5.  minutes 0 seconds.  Withdrawal time=12 minutes 0 seconds.  The scope was withdrawn and the procedure completed. COMPLICATIONS: There were no complications.  ENDOSCOPIC IMPRESSION:proctitis without evidence of severe colitis. There is proctitis the rectum, but the patient's symptoms worsen any time.  Certainly no evidence of significant relapse of. her IBD.  RECOMMENDATIONS:continue current medications and consider psychological counseling and referral.   eSigned:  DSable Feil MD, [facility]07/30/2014 3:06 PM   cc: CElyse Hsu MD

## 2013-04-28 NOTE — Progress Notes (Signed)
Called to room to assist during endoscopic procedure.  Patient ID and intended procedure confirmed with present staff. Received instructions for my participation in the procedure from the performing physician.  

## 2013-04-28 NOTE — Progress Notes (Signed)
Pt has passed a large amt of air while in the RR.  Denies pain at discharge  Patient did not experience any of the following events: a burn prior to discharge; a fall within the facility; wrong site/side/patient/procedure/implant event; or a hospital transfer or hospital admission upon discharge from the facility. ([id number])Patient did not have preoperative order for IV antibiotic SSI prophylaxis. [phone])

## 2013-04-28 NOTE — Patient Instructions (Addendum)
YOU HAD AN ENDOSCOPIC PROCEDURE TODAY AT [facility]: Refer to the procedure report that was given to you for any specific questions about what was found during the examination.  If the procedure report does not answer your questions, please call your gastroenterologist to clarify.  If you requested that your care partner not be given the details of your procedure findings, then the procedure report has been included in a sealed envelope for you to review at your convenience later.  YOU SHOULD EXPECT: Some feelings of bloating in the abdomen. Passage of more gas than usual.  Walking can help get rid of the air that was put into your GI tract during the procedure and reduce the bloating. If you had a lower endoscopy (such as a colonoscopy or flexible sigmoidoscopy) you may notice spotting of blood in your stool or on the toilet paper. If you underwent a bowel prep for your procedure, then you may not have a normal bowel movement for a few days.  DIET: Your first meal following the procedure should be a light meal and then it is ok to progress to your normal diet.  A half-sandwich or bowl of soup is an example of a good first meal.  Heavy or fried foods are harder to digest and may make you feel nauseous or bloated.  Likewise meals heavy in dairy and vegetables can cause extra gas to form and this can also increase the bloating.  Drink plenty of fluids but you should avoid alcoholic beverages for 24 hours.  ACTIVITY: Your care partner should take you home directly after the procedure.  You should plan to take it easy, moving slowly for the rest of the day.  You can resume normal activity the day after the procedure however you should NOT DRIVE or use heavy machinery for 24 hours (because of the sedation medicines used during the test).    SYMPTOMS TO REPORT IMMEDIATELY: A gastroenterologist can be reached at any hour.  During normal business hours, 8:30 AM to 5:00 PM Monday through Friday,  call [phone].  After hours and on weekends, please call the GI answering service at [phone] who will take a message and have the physician on call contact you.   Following lower endoscopy (colonoscopy or flexible sigmoidoscopy):  Excessive amounts of blood in the stool  Significant tenderness or worsening of abdominal pains  Swelling of the abdomen that is new, acute  Fever of 100F or higher  FOLLOW UP: If any biopsies were taken you will be contacted by phone or by letter within the next 1-3 weeks.  Call your gastroenterologist if you have not heard about the biopsies in 3 weeks.  Our staff will call the home number listed on your records the next business day following your procedure to check on you and address any questions or concerns that you may have at that time regarding the information given to you following your procedure. This is a courtesy call and so if there is no answer at the home number and we have not heard from you through the emergency physician on call, we will assume that you have returned to your regular daily activities without incident.  SIGNATURES/CONFIDENTIALITY: You and/or your care partner have signed paperwork which will be entered into your electronic medical record.  These signatures attest to the fact that that the information above on your After Visit Summary has been reviewed and is understood.  Full responsibility of the confidentiality of this  discharge information lies with you and/or your care-partner.  Continue your normal medicines  Await pathology results

## 2013-04-29 ENCOUNTER — Telehealth: Payer: Self-pay

## 2013-04-29 NOTE — Telephone Encounter (Signed)
  Follow up Call-  Call back number 04/28/2013 08/28/2011  Post procedure Call Back phone  # [phone] cell [phone] mess ok  Permission to leave phone message Yes -     Patient questions:  Do you have a fever, pain , or abdominal swelling? no Pain Score  0 *  Have you tolerated food without any problems? yes  Have you been able to return to your normal activities? yes  Do you have any questions about your discharge instructions: Diet   no Medications  no Follow up visit  no  Do you have questions or concerns about your Care? no  Actions: * If pain score is 4 or above: No action needed, pain <4.  No problems per the pt. Maw

## 2013-05-03 ENCOUNTER — Encounter: Payer: Self-pay | Admitting: Gastroenterology

## 2013-05-03 ENCOUNTER — Encounter: Payer: Self-pay | Admitting: Obstetrics and Gynecology

## 2013-05-03 ENCOUNTER — Ambulatory Visit (INDEPENDENT_AMBULATORY_CARE_PROVIDER_SITE_OTHER): Payer: BC Managed Care – PPO | Admitting: Obstetrics and Gynecology

## 2013-05-03 VITALS — BP 118/76 | HR 72 | Resp 18 | Ht 62.75 in | Wt 103.0 lb

## 2013-05-03 DIAGNOSIS — Z01419 Encounter for gynecological examination (general) (routine) without abnormal findings: Secondary | ICD-10-CM

## 2013-05-03 MED ORDER — FLUOXETINE HCL 10 MG PO CAPS: 20.0000 mg | ORAL_CAPSULE | Freq: Every day | ORAL | Status: DC

## 2013-05-03 MED ORDER — ESTRADIOL 1 MG PO TABS: 1.0000 mg | ORAL_TABLET | Freq: Every day | ORAL | Status: DC

## 2013-05-03 NOTE — Patient Instructions (Signed)

## 2013-05-03 NOTE — Progress Notes (Signed)
57 y.o.   Married    Caucasian   female   [id number]   here for annual exam.  Stressed. Was having rectal bleeding and had a colonoscopy which was ok. Wants to increase prozac d/t more anxiety recently with her daughter having "a really bad time"  No LMP recorded. Patient has had a hysterectomy.          Sexually active: yes  The current method of family planning is Ortho-Evra patches weekly and status post hysterectomy.    Exercising: walking, weights, zumba 3-4 days a week Last mammogram: 02/2013  Last pap smear:12/31/2000 History of abnormal pap: ? 1995 Abnormal cells Smoking: quit 20 years ago Alcohol: 1-2 glasses of wine a week Last colonoscopy:03/2013 normal, repeat in 5 years Last Bone Density:  2013 osteopenia Last tetanus shot:12/2001 Last cholesterol check: 2013 normal  Hgb:  pcp              Urine: pcp   Family History  Problem Relation Age of Onset  . Heart disease Father   . Colon cancer Neg Hx   . Esophageal cancer Neg Hx   . Rectal cancer Neg Hx   . Stomach cancer Neg Hx   . Hypertension Mother   . Hearing loss Mother     Patient Active Problem List   Diagnosis Date Noted  . Other and unspecified noninfectious gastroenteritis and colitis 08/28/2011  . BRBPR (bright red blood per rectum) 08/28/2011  . OTHER MALIGNANT NEOPLASM OTHER SPEC SITES SKIN 06/20/2009  . B12 DEFICIENCY 06/08/2008  . ULCERATIVE COLITIS 06/02/2008  . MENOPAUSAL SYNDROME 06/02/2008  . HYPERPARATHYROIDISM UNSPECIFIED 01/11/2008  . HEADACHE 06/04/2007    Past Medical History  Diagnosis Date  . Vitamin B12 deficiency   . Hyperparathyroidism, unspecified   . Headache(784.0)     Migraines  . Symptomatic menopausal or female climacteric states   . IBS (irritable bowel syndrome)   . Hemorrhoids 10/97    Symptomatic   . 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 Hyperparathyroidism   . Depression   . 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 Ulcerative colitis, unspecified   . Anemia     during  pregancy  . Thyroid disease     parathyroidism    Past Surgical History  Procedure Laterality Date  . Total abdominal hysterectomy w/ bilateral salpingoophorectomy    . Thyroid surgery Right 3/0000    Parathyroidectomy  . Squamous cell carcinoma excision    . Leg surgery      2012  . Diagnostic laparoscopy Left 2/95    LO Cyst- Abn cells  . Laparoscopy  3/95    benign serous cystodema  . Abdominal hysterectomy  11/96    LAVH RSO- AUB, pain, fibroids RO cyst  . Parathyroidectomy Right 2000, 07/2000, 2002  . Colonoscopy      Allergies: Vivelle  Current Outpatient Prescriptions  Medication Sig Dispense Refill  . estrogens, conjugated, (PREMARIN) 0.625 MG tablet Take 0.625 mg by mouth daily. Take daily for 21 days then do not take for 7 days.       Marland Kitchen FLUoxetine (PROZAC) 10 MG capsule Take 10 mg by mouth daily.       Marland Kitchen Hyoscyamine-Phenyltoloxamine (DIGEX NF) 5.1884-16 MG CAPS Take 1 tablet by mouth 2 (two) times daily as needed.  12 each  0  . mercaptopurine (PURINETHOL) 50 MG tablet Take 1 tablet (50 mg total) by mouth daily.  90 tablet  3  . mesalamine (CANASA) 1000 MG suppository  Place 1 suppository (1,000 mg total) rectally at bedtime as needed.  30 suppository  1  . Nutritional Supplements (JUICE PLUS FIBRE PO) Take 1 tablet by mouth daily.      Marland Kitchen topiramate (TOPAMAX) 100 MG tablet Take 50 mg by mouth daily.       Marland Kitchen tretinoin (RETIN-A) 0.025 % cream Apply topically as directed.       . Vitamin D, Ergocalciferol, (DRISDOL) 50000 UNITS CAPS Take 50,000 Units by mouth every 14 (fourteen) days.       Marland Kitchen zolmitriptan (ZOMIG) 5 MG tablet Take 5 mg by mouth as needed.         No current facility-administered medications for this visit.    ROS: Pertinent items are noted in HPI.  Social Hx: married in 2012, two children, works as a Geneticist, molecular   Exam:    BP 118/76  Pulse 72  Resp 18  Ht 5' 2.75" (1.594 m)  Wt 103 lb (46.72 kg)  BMI 18.39 kg/m2 Ht stable and wt  down 4 pounds from last year  Wt Readings from Last 3 Encounters:  05/03/13 103 lb (46.72 kg)  04/28/13 103 lb (46.72 kg)  04/26/13 103 lb 6.4 oz (46.902 kg)     Ht Readings from Last 3 Encounters:  05/03/13 5' 2.75" (1.594 m)  04/28/13 5' 2.75" (1.594 m)  04/26/13 5' 2.75" (1.594 m)    General appearance: alert, cooperative and appears stated age Head: Normocephalic, without obvious abnormality, atraumatic Neck: no adenopathy, supple, symmetrical, trachea midline and thyroid not enlarged, symmetric, no tenderness/mass/nodules Lungs: clear to auscultation bilaterally Breasts: Inspection negative, No nipple retraction or dimpling, No nipple discharge or bleeding, No axillary or supraclavicular adenopathy, Normal to palpation without dominant masses Heart: regular rate and rhythm Abdomen: soft, non-tender; bowel sounds normal; no masses,  no organomegaly Extremities: extremities normal, atraumatic, no cyanosis or edema Skin: Skin color, texture, turgor normal. No rashes or lesions Lymph nodes: Cervical, supraclavicular, and axillary nodes normal. No abnormal inguinal nodes palpated Neurologic: Grossly normal   Pelvic: External genitalia:  no lesions              Urethra:  normal appearing urethra with no masses, tenderness or lesions              Bartholins and Skenes: normal                 Vagina: normal appearing vagina with normal color and discharge, no lesions              Cervix: absent              Pap taken: no        Bimanual Exam:  Uterus:  absent                                      Adnexa: absent, nontender and no masses                                      Rectovaginal: Confirms                                      Anus:  normal sphincter tone, no lesions  A: normal menopausal  exam, on ERT     LAVH/RSO fiboids, prev LSO serous cystadenoma     Hyperparathyroidism     Depression, ulcerative colitis, migraines     P:     mammogram counseled on breast self exam,  mammography screening, adequate intake of calcium and vitamin D, diet and exercise return annually or prn     An After Visit Summary was printed and given to the patient.

## 2013-05-04 ENCOUNTER — Other Ambulatory Visit: Payer: Self-pay | Admitting: Gastroenterology

## 2013-05-04 DIAGNOSIS — K51218 Ulcerative (chronic) proctitis with other complication: Secondary | ICD-10-CM

## 2013-05-04 LAB — THIOPURINE METABOLITES: 6-MMPN Metaboilte: 9860 pmol/8x 10E8

## 2013-05-10 ENCOUNTER — Telehealth: Payer: Self-pay | Admitting: Obstetrics and Gynecology

## 2013-05-10 NOTE — Telephone Encounter (Signed)
Refill request for VITAMIN D QOW Last filled by MD on - 10/28/12, #14 X 0 Last AEX - 05/03/13 Next AEX - 05/05/14 LAST VITAMIN D - 12/12/08 = 33 Please advise refills.  Paper chart on your desk.

## 2013-05-10 NOTE — Telephone Encounter (Signed)
Rec she take OTC vit D 2000 units per day.

## 2013-05-10 NOTE — Telephone Encounter (Signed)
Refill of vit D [facility] on [address]

## 2013-05-14 NOTE — Telephone Encounter (Signed)
Pt notified and is agreeable.

## 2013-05-14 NOTE — Telephone Encounter (Signed)
Message left for pt to return call

## 2013-06-30 ENCOUNTER — Other Ambulatory Visit: Payer: Self-pay | Admitting: Gastroenterology

## 2013-07-13 ENCOUNTER — Encounter: Payer: Self-pay | Admitting: Gynecology

## 2013-08-03 ENCOUNTER — Telehealth: Payer: Self-pay | Admitting: *Deleted

## 2013-08-03 NOTE — Telephone Encounter (Signed)
Message copied by Lance Morin on Tue Aug 03, 2013  7:56 AM ------      Message from: HUNT, Virginia R      Created: Tue May 04, 2013  1:03 PM      Regarding: Labs       Pt needs labs, orders in epic. ------

## 2013-08-04 NOTE — Telephone Encounter (Signed)
Mailed pt a letter requesting she come for repeat labs; CBC and Hepatic Function.

## 2013-08-05 ENCOUNTER — Other Ambulatory Visit: Payer: Self-pay

## 2013-08-11 ENCOUNTER — Other Ambulatory Visit (INDEPENDENT_AMBULATORY_CARE_PROVIDER_SITE_OTHER): Payer: BC Managed Care – PPO

## 2013-08-11 DIAGNOSIS — K51218 Ulcerative (chronic) proctitis with other complication: Secondary | ICD-10-CM

## 2013-08-11 DIAGNOSIS — K5120 Ulcerative (chronic) proctitis without complications: Secondary | ICD-10-CM

## 2013-08-11 LAB — CBC WITH DIFFERENTIAL/PLATELET
Basophils Relative: 0.3 % (ref 0.0–3.0)
Eosinophils Relative: 0.6 % (ref 0.0–5.0)
Monocytes Relative: 7.3 % (ref 3.0–12.0)
Neutrophils Relative %: 65.2 % (ref 43.0–77.0)
Platelets: 305 10*3/uL (ref 150.0–400.0)
RBC: 3.58 Mil/uL — ABNORMAL LOW (ref 3.87–5.11)
WBC: 3.4 10*3/uL — ABNORMAL LOW (ref 4.5–10.5)

## 2013-08-11 LAB — HEPATIC FUNCTION PANEL
ALT: 18 U/L (ref 0–35)
AST: 17 U/L (ref 0–37)
Alkaline Phosphatase: 32 U/L — ABNORMAL LOW (ref 39–117)
Bilirubin, Direct: 0.1 mg/dL (ref 0.0–0.3)
Total Bilirubin: 0.9 mg/dL (ref 0.3–1.2)
Total Protein: 6.4 g/dL (ref 6.0–8.3)

## 2013-08-16 ENCOUNTER — Encounter: Payer: Self-pay | Admitting: *Deleted

## 2013-08-16 ENCOUNTER — Telehealth: Payer: Self-pay | Admitting: *Deleted

## 2013-08-16 DIAGNOSIS — K5289 Other specified noninfective gastroenteritis and colitis: Secondary | ICD-10-CM

## 2013-08-16 NOTE — Telephone Encounter (Signed)
Notes Recorded by Sable Feil, MD on 08/11/2013 at 2:42 PM Repeat CBC in 2 weeks and see me soon  Informed pt of results and she will have a CBC on the day she sees Dr Sharlett Iles; 08/24/13.

## 2013-08-24 ENCOUNTER — Encounter: Payer: Self-pay | Admitting: Gastroenterology

## 2013-08-24 ENCOUNTER — Ambulatory Visit (INDEPENDENT_AMBULATORY_CARE_PROVIDER_SITE_OTHER): Payer: BC Managed Care – PPO | Admitting: Gastroenterology

## 2013-08-24 ENCOUNTER — Other Ambulatory Visit (INDEPENDENT_AMBULATORY_CARE_PROVIDER_SITE_OTHER): Payer: BC Managed Care – PPO

## 2013-08-24 VITALS — BP 102/68 | HR 82 | Ht 63.0 in | Wt 104.2 lb

## 2013-08-24 DIAGNOSIS — K5190 Ulcerative colitis, unspecified, without complications: Secondary | ICD-10-CM

## 2013-08-24 DIAGNOSIS — Z9889 Other specified postprocedural states: Secondary | ICD-10-CM

## 2013-08-24 LAB — FERRITIN: Ferritin: 21.1 ng/mL (ref 10.0–291.0)

## 2013-08-24 LAB — CBC WITH DIFFERENTIAL/PLATELET
Basophils Absolute: 0 10*3/uL (ref 0.0–0.1)
Basophils Relative: 0.5 % (ref 0.0–3.0)
Eosinophils Relative: 0.6 % (ref 0.0–5.0)
HCT: 36.5 % (ref 36.0–46.0)
Hemoglobin: 12.7 g/dL (ref 12.0–15.0)
Lymphocytes Relative: 36.8 % (ref 12.0–46.0)
Lymphs Abs: 1.4 10*3/uL (ref 0.7–4.0)
MCV: 103.2 fl — ABNORMAL HIGH (ref 78.0–100.0)
Monocytes Absolute: 0.3 10*3/uL (ref 0.1–1.0)
Neutro Abs: 2.1 10*3/uL (ref 1.4–7.7)
Platelets: 276 10*3/uL (ref 150.0–400.0)
WBC: 3.8 10*3/uL — ABNORMAL LOW (ref 4.5–10.5)

## 2013-08-24 LAB — IBC PANEL
Iron: 164 ug/dL — ABNORMAL HIGH (ref 42–145)
Saturation Ratios: 51.6 % — ABNORMAL HIGH (ref 20.0–50.0)
Transferrin: 226.9 mg/dL (ref 212.0–360.0)

## 2013-08-24 NOTE — Patient Instructions (Signed)
Your physician has requested that you go to the basement for the following lab work before leaving today: CBC Anemia Panel  Come back November 24, 2013 for labs:: CBC and Liver Function Panel Lab is open 730 am to 530 pm   In three months make an appointment with Dr. Hilarie Fredrickson

## 2013-08-24 NOTE — Progress Notes (Signed)
History of Present Illness: This is a 57 year old Caucasian female who has had ulcerative colitis for over 20 years.  Recent dysplasia screening colonoscopy was fairly unremarkable.  We had previously tried to stop her 6-MP that she been on for many years, and she quickly had a relapse of her colitis, and is currently back on 6-MP 50 mg a day, and is totally asymptomatic at this point.  There's been some discrepancy in her clinical response and her 6-MP metabolite levels, but she has had no evidence of abnormal liver function tests, and a white count seems to have leveled at 3400.  Currently she is having regular bowel movements without melena, Mavik easy, abdominal pain or any systemic complaints.  Other medications are listed and reviewed her chart.  She does use Canasa suppositories at bedtime as needed.    Current Medications, Allergies, Past Medical History, Past Surgical History, Family History and Social History were reviewed in [employer] record.  ROS: All systems were reviewed and are negative unless otherwise stated in the HPI.         Assessment and plan: Question colitis in remission on low-dose immunosuppressive therapy.  We'll repeat her CBC today, and if this is stable we'll repeat CBC and liver profile in 3 months when she sees Dr.m Pyrtyle will be assuming her care.  I'll ask our PA to check her immunization status.  She does receive regular GYN evaluations.  She is status post parathyroidectomy for parathyroid adenoma.  CC [place].PA

## 2013-09-02 ENCOUNTER — Other Ambulatory Visit: Payer: Self-pay | Admitting: Dermatology

## 2013-10-27 ENCOUNTER — Other Ambulatory Visit: Payer: Self-pay | Admitting: Gastroenterology

## 2013-10-29 ENCOUNTER — Other Ambulatory Visit: Payer: Self-pay | Admitting: Gastroenterology

## 2014-03-07 ENCOUNTER — Other Ambulatory Visit: Payer: Self-pay | Admitting: Gastroenterology

## 2014-03-07 NOTE — Telephone Encounter (Signed)
Ok to refill Pt needs OV for followup and labs

## 2014-03-07 NOTE — Telephone Encounter (Signed)
Patient has follow up visit with you on 04-20-2014 Is it okay to refill Canasa

## 2014-03-08 NOTE — Telephone Encounter (Signed)
LMOM for patient to call back.

## 2014-03-08 NOTE — Telephone Encounter (Signed)
Made appointment to see Nicoletta Ba on Friday June 12

## 2014-03-11 ENCOUNTER — Encounter: Payer: Self-pay | Admitting: Physician Assistant

## 2014-03-11 ENCOUNTER — Other Ambulatory Visit: Payer: Self-pay | Admitting: *Deleted

## 2014-03-11 ENCOUNTER — Ambulatory Visit (INDEPENDENT_AMBULATORY_CARE_PROVIDER_SITE_OTHER): Payer: BC Managed Care – PPO | Admitting: Physician Assistant

## 2014-03-11 VITALS — BP 110/70 | HR 74 | Ht 63.0 in | Wt 104.0 lb

## 2014-03-11 DIAGNOSIS — K5190 Ulcerative colitis, unspecified, without complications: Secondary | ICD-10-CM | POA: Insufficient documentation

## 2014-03-11 DIAGNOSIS — K625 Hemorrhage of anus and rectum: Secondary | ICD-10-CM

## 2014-03-11 MED ORDER — MESALAMINE 1000 MG RE SUPP: 1000.0000 mg | Freq: Every day | RECTAL | Status: DC

## 2014-03-11 MED ORDER — MERCAPTOPURINE 50 MG PO TABS: 50.0000 mg | ORAL_TABLET | Freq: Every day | ORAL | Status: DC

## 2014-03-11 MED ORDER — BUDESONIDE 9 MG PO TB24: 9.0000 mg | ORAL_TABLET | Freq: Every day | ORAL | Status: DC

## 2014-03-11 NOTE — Patient Instructions (Signed)
.  Please go to the basement level to have your labs drawn.   We sent refills to [facility] to [address] 1. Mercaptapurine ( 6MP) 2. Uceris 3. Canasa suppositories  Stay on the Uceris , 1 tab daily until you have the follow up with Dr. Hilarie Fredrickson,.  Appointment: 05-12-2014 @ 2:00 PM  With Dr. Hilarie Fredrickson.

## 2014-03-11 NOTE — Progress Notes (Addendum)
Subjective:    Patient ID: Ellen Parks, female    DOB: 02/18/1956, 58 y.o.   MRN: [id number]  HPI Ellen Parks  is a pleasant 58 year old white female known previously to Ellen Parks with long history of ulcerative colitis dating back at least 20 years. She has been fairly well controlled with 6-MP at 50 mg daily. Last colonoscopy was done in July of 2014 and showed active proctitis only she did have some pseudopolyps in the right colon. She has used Ellen Parks on an as-needed basis for rectal bleeding. She comes in today for followup. She says she has been doing well overall and feels good. She has no complaints of abdominal pain or cramping. She generally has one to 2 bowel movements per day for soft. She says most of the time she has blood with her bowel movements and may pass bloody but if she just urinates. She says intermittently she will see larger amounts of blood and mucus. He says the Canasa seems to help if she stays on a regularly but if she stops it for any period of time of the bleeding and mucus return fairly quickly.    Review of Systems  Constitutional: Negative.   HENT: Negative.   Eyes: Negative.   Respiratory: Negative.   Cardiovascular: Negative.   Gastrointestinal: Positive for blood in stool and anal bleeding.  Endocrine: Negative.   Genitourinary: Negative.   Musculoskeletal: Negative.   Skin: Negative.   Allergic/Immunologic: Negative.   Neurological: Negative.   Hematological: Negative.   Psychiatric/Behavioral: Negative.    Outpatient Prescriptions Prior to Visit  Medication Sig Dispense Refill  . CANASA 1000 MG suppository PLACE 1 SUPPOSITORY (1,000 MG TOTAL) RECTALLYAT BEDTIME AS NEEDED.  30 suppository  1  . mercaptopurine (PURINETHOL) 50 MG tablet TAKE 1 TABLET BY MOUTH ONCE A DAY  300 tablet  0  . Parks Supplements (JUICE PLUS FIBRE PO) Take 4 tablets by mouth daily.       Marland Kitchen topiramate (TOPAMAX) 100 MG tablet Take 50 mg by mouth daily.       Marland Kitchen  tretinoin (RETIN-A) 0.025 % cream Apply topically as directed.       . zolmitriptan (ZOMIG) 5 MG tablet Take 5 mg by mouth as needed.        Marland Kitchen FLUoxetine (PROZAC) 10 MG capsule Take 2 capsules (20 mg total) by mouth daily.  90 capsule  3  . Vitamin D, Ergocalciferol, (DRISDOL) 50000 UNITS CAPS Take 50,000 Units by mouth every 14 (fourteen) days.       . Cholecalciferol (VITAMIN D3) 3000 UNITS TABS Take 1 capsule by mouth daily.       No facility-administered medications prior to visit.   Allergies  Allergen Reactions  . Vivelle [Estradiol] Rash       Patient Active Problem List   Diagnosis Date Noted  . Ulcerative colitis, unspecified 03/11/2014  . OTHER MALIGNANT NEOPLASM OTHER SPEC SITES SKIN 06/20/2009  . B12 DEFICIENCY 06/08/2008  . MENOPAUSAL SYNDROME 06/02/2008  . HYPERPARATHYROIDISM UNSPECIFIED 01/11/2008  . HEADACHE 06/04/2007   History  Substance Use Topics  . Smoking status: Former Smoker    Types: Cigarettes    Quit date: 05/03/1993  . Smokeless tobacco: Never Used  . Alcohol Use: 0.5 - 1.0 oz/week    1-2 drink(s) per week     Comment: 1-2 glasses of wine weekly   family history includes Hearing loss in her mother; Heart disease in her father; Hypertension in her mother. There is no  history of Colon cancer, Esophageal cancer, Rectal cancer, or Stomach cancer.  Objective:   Physical Exam   white female in no acute distress, pleasant blood pressure 110/70 pulse 74 height 5 foot 3 weight 104. HEENT; nontraumatic normocephalic EOMI PERRLA sclera anicteric, Supple ;no JVD, Cardiovascular; regular rate and rhythm with S1-S2 no murmur or gallop, Pulmonary; clear bilaterally, Abdomen; soft nontender nondistended bowel sounds are active there is no palpable mass ext; no clubbing cyanosis or edema skin warm dry, Psych ;mood and affect appropriate        Assessment & Plan:  #107  58 year old female with long history of ulcerative colitis with persistent mild symptoms of  active proctitis. #2 history of hyperparathyroidism  Plan; check CBC and hepatic panel Continue 6-MP 50 mg by mouth daily several refills given Continue Canasa suppositories at bedtime. She is encouraged to use these regularly as long as she is seeing any blood. Refill sent We'll add a trial of Ulceris 9 milligrams by mouth daily x8 weeks to see if we can get the proctitis under better control. She will followup with Dr. Hilarie Parks in 6-8 weeks.  Addendum: Reviewed and agree with initial management. Jerene Bears, MD

## 2014-03-15 ENCOUNTER — Other Ambulatory Visit (INDEPENDENT_AMBULATORY_CARE_PROVIDER_SITE_OTHER): Payer: BC Managed Care – PPO

## 2014-03-15 DIAGNOSIS — K625 Hemorrhage of anus and rectum: Secondary | ICD-10-CM

## 2014-03-15 LAB — CBC WITH DIFFERENTIAL/PLATELET
Basophils Absolute: 0 10*3/uL (ref 0.0–0.1)
Basophils Relative: 0.4 % (ref 0.0–3.0)
Eosinophils Absolute: 0 10*3/uL (ref 0.0–0.7)
Eosinophils Relative: 0.6 % (ref 0.0–5.0)
HCT: 36.6 % (ref 36.0–46.0)
HEMOGLOBIN: 12.4 g/dL (ref 12.0–15.0)
Lymphocytes Relative: 24.4 % (ref 12.0–46.0)
Lymphs Abs: 1.3 10*3/uL (ref 0.7–4.0)
MCHC: 33.8 g/dL (ref 30.0–36.0)
MCV: 106.5 fl — AB (ref 78.0–100.0)
MONO ABS: 0.4 10*3/uL (ref 0.1–1.0)
Monocytes Relative: 7.4 % (ref 3.0–12.0)
NEUTROS ABS: 3.6 10*3/uL (ref 1.4–7.7)
Neutrophils Relative %: 67.2 % (ref 43.0–77.0)
Platelets: 292 10*3/uL (ref 150.0–400.0)
RBC: 3.44 Mil/uL — AB (ref 3.87–5.11)
RDW: 14.5 % (ref 11.5–15.5)
WBC: 5.3 10*3/uL (ref 4.0–10.5)

## 2014-03-15 LAB — HEPATIC FUNCTION PANEL
ALBUMIN: 4.3 g/dL (ref 3.5–5.2)
ALT: 23 U/L (ref 0–35)
AST: 22 U/L (ref 0–37)
Alkaline Phosphatase: 33 U/L — ABNORMAL LOW (ref 39–117)
Bilirubin, Direct: 0.2 mg/dL (ref 0.0–0.3)
TOTAL PROTEIN: 6.6 g/dL (ref 6.0–8.3)
Total Bilirubin: 0.8 mg/dL (ref 0.2–1.2)

## 2014-03-18 ENCOUNTER — Telehealth: Payer: Self-pay | Admitting: Physician Assistant

## 2014-03-18 NOTE — Telephone Encounter (Signed)
Spoke with patient and told her her Hgb and WBC looked good on her labs. She is aware Nicoletta Ba, PA has not reviewed them yet.

## 2014-03-22 ENCOUNTER — Other Ambulatory Visit: Payer: Self-pay | Admitting: *Deleted

## 2014-03-22 DIAGNOSIS — R718 Other abnormality of red blood cells: Secondary | ICD-10-CM

## 2014-03-22 NOTE — Telephone Encounter (Signed)
ok 

## 2014-03-29 ENCOUNTER — Telehealth: Payer: Self-pay | Admitting: Physician Assistant

## 2014-03-29 ENCOUNTER — Other Ambulatory Visit (INDEPENDENT_AMBULATORY_CARE_PROVIDER_SITE_OTHER): Payer: BC Managed Care – PPO

## 2014-03-29 DIAGNOSIS — R718 Other abnormality of red blood cells: Secondary | ICD-10-CM

## 2014-03-29 LAB — VITAMIN B12: Vitamin B-12: >1500 pg/mL — ABNORMAL HIGH (ref 211–911)

## 2014-03-29 LAB — FOLATE: Folate: >24.8 ng/mL (ref >5.9)

## 2014-03-29 NOTE — Telephone Encounter (Signed)
I called and told the patient I will talk to Nicoletta Ba PA-C tomorrow morning. She said she took the samples and they really helped but her [facility] insurance won't cover it. They sent her a letter stating she needed to let her try an alternative but don't tell her what medication they suggest.  I told her I will get back to her.

## 2014-03-30 NOTE — Telephone Encounter (Signed)
I called the patient to advise I have put samples at our front desk for her per Amy Esterwood PA-C.  Also she asked me about her B12 and folate results. I told her I would have Amy look at them and call her back.

## 2014-03-30 NOTE — Telephone Encounter (Signed)
LM for the patient to advise the B12 and folate lever were both normal, per Amy Esterwood PA-C.  The fact the B12 was 1500  , high, was not a concern at all , per Amy.  It is when the level is low they get concerned.

## 2014-04-20 ENCOUNTER — Ambulatory Visit: Payer: BC Managed Care – PPO | Admitting: Internal Medicine

## 2014-05-03 ENCOUNTER — Other Ambulatory Visit: Payer: Self-pay

## 2014-05-04 NOTE — Telephone Encounter (Addendum)
Faxed refill request for Estradiol Last AEX: 05/03/13 Last refill:05/03/13 #90, 3 rfs Last MMG: 03/02/13 BI-RADS -Benign Findings Current AEX:05/06/14 with Dr. Charlies Constable  Please advise

## 2014-05-05 ENCOUNTER — Ambulatory Visit: Payer: BC Managed Care – PPO | Admitting: Gynecology

## 2014-05-06 ENCOUNTER — Encounter: Payer: Self-pay | Admitting: Gynecology

## 2014-05-06 ENCOUNTER — Ambulatory Visit (INDEPENDENT_AMBULATORY_CARE_PROVIDER_SITE_OTHER): Payer: BC Managed Care – PPO | Admitting: Gynecology

## 2014-05-06 VITALS — BP 100/60 | HR 70 | Resp 14 | Ht 63.0 in | Wt 104.0 lb

## 2014-05-06 DIAGNOSIS — Z733 Stress, not elsewhere classified: Secondary | ICD-10-CM

## 2014-05-06 DIAGNOSIS — Z01419 Encounter for gynecological examination (general) (routine) without abnormal findings: Secondary | ICD-10-CM

## 2014-05-06 DIAGNOSIS — Z79890 Hormone replacement therapy (postmenopausal): Secondary | ICD-10-CM

## 2014-05-06 DIAGNOSIS — F439 Reaction to severe stress, unspecified: Secondary | ICD-10-CM

## 2014-05-06 MED ORDER — ESTRADIOL 0.5 MG PO TABS: 0.5000 mg | ORAL_TABLET | Freq: Every day | ORAL | Status: DC

## 2014-05-06 MED ORDER — FLUOXETINE HCL 10 MG PO CAPS: 10.0000 mg | ORAL_CAPSULE | Freq: Every day | ORAL | Status: DC

## 2014-05-06 NOTE — Patient Instructions (Signed)
We will half estrogen dose Continue prozac for 2-64m then 1 capsule every other day for 2w then stop

## 2014-05-06 NOTE — Progress Notes (Signed)
58 y.o. Married Caucasian female   [phone] here for annual exam. Patient's menses are absent due to Hysterectomy. does have vaginal dryness.  She is not using lubricants.  She does not report post-menopasual bleeding.  Last DEX[place] in [place], unsure of place.   No LMP recorded. Patient has had a hysterectomy.          Sexually active: Yes.    The current method of family planning is status post hysterectomy.    Exercising: Yes.    walking,running,weights,cardio 5x/wk Last pap: 12/31/00 Negative Abnormal PAP: Mammogram: 03/02/13 Bi-Rads 1 BSE: yes  Colonoscopy: 04/28/13 f/u q 2 years ago  DEXA: 2013 Alcohol: 2 drinks/wk Tobacco: no  Labs: Shirline Frees, MD  Health Maintenance  Topic Date Due  . Pap Smear  01/01/2004  . Tetanus/tdap  12/30/2011  . Influenza Vaccine  04/30/2014  . Mammogram  03/02/2015  . Colonoscopy  04/28/2016    Family History  Problem Relation Age of Onset  . Heart disease Father   . Colon cancer Neg Hx   . Esophageal cancer Neg Hx   . Rectal cancer Neg Hx   . Stomach cancer Neg Hx   . Hypertension Mother   . Hearing loss Mother     Patient Active Problem List   Diagnosis Date Noted  . Ulcerative colitis, unspecified 03/11/2014  . OTHER MALIGNANT NEOPLASM OTHER SPEC SITES SKIN 06/20/2009  . B12 DEFICIENCY 06/08/2008  . MENOPAUSAL SYNDROME 06/02/2008  . HYPERPARATHYROIDISM UNSPECIFIED 01/11/2008  . HEADACHE 06/04/2007    Past Medical History  Diagnosis Date  . Vitamin B12 deficiency   . Headache(784.0)     Migraines  . Symptomatic menopausal or female climacteric states   . IBS (irritable bowel syndrome)   . Hemorrhoids 10/97    Symptomatic   . 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 Hyperparathyroidism   . Depression   . 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 Ulcerative colitis, unspecified   . Anemia     during pregancy  . Thyroid disease     parathyroidism    Past Surgical History  Procedure Laterality Date  . Total abdominal hysterectomy  w/ bilateral salpingoophorectomy    . Thyroid surgery Right 3/0000    Parathyroidectomy  . Squamous cell carcinoma excision    . Leg surgery      2012  . Diagnostic laparoscopy Left 2/95    LO Cyst- Abn cells  . Laparoscopy  3/95    benign serous cystodema  . Abdominal hysterectomy  11/96    LAVH RSO- AUB, pain, fibroids RO cyst  . Parathyroidectomy Right 2000, 07/2000, 2002  . Colonoscopy      Allergies: Vivelle  Current Outpatient Prescriptions  Medication Sig Dispense Refill  . Budesonide (UCERIS) 9 MG TB24 Take 9 mg by mouth daily.  30 tablet  1  . Cholecalciferol (VITAMIN D3) 5000 UNITS CAPS Take 1 capsule by mouth daily.      Marland Kitchen FLUoxetine (PROZAC) 10 MG capsule Take 10 mg by mouth daily.      . mercaptopurine (PURINETHOL) 50 MG tablet Take 1 tablet (50 mg total) by mouth daily. Give on an empty stomach 1 hour before or 2 hours after meals. Caution: Chemotherapy.  30 tablet  8  . mesalamine (CANASA) 1000 MG suppository Place 1 suppository (1,000 mg total) rectally at bedtime.  30 suppository  8  . Nutritional Supplements (JUICE PLUS FIBRE PO) Take 4 tablets by mouth daily.       Marland Kitchen  topiramate (TOPAMAX) 100 MG tablet Take 50 mg by mouth daily.       Marland Kitchen tretinoin (RETIN-A) 0.025 % cream Apply topically as directed.       . zolmitriptan (ZOMIG) 5 MG tablet Take 5 mg by mouth as needed.         No current facility-administered medications for this visit.    ROS: Pertinent items are noted in HPI.  Exam:    BP 100/60  Pulse 70  Resp 14  Ht 5' 3"  (1.6 m)  Wt 104 lb (47.174 kg)  BMI 18.43 kg/m2 Weight change: @WEIGHTCHANGE @ Last 3 height recordings:  Ht Readings from Last 3 Encounters:  05/06/14 5' 3"  (1.6 m)  03/11/14 5' 3"  (1.6 m)  08/24/13 5' 3"  (1.6 m)   General appearance: alert, cooperative and appears stated age Head: Normocephalic, without obvious abnormality, atraumatic Neck: no adenopathy, no carotid bruit, no JVD, supple, symmetrical, trachea midline and  thyroid not enlarged, symmetric, no tenderness/mass/nodules Lungs: clear to auscultation bilaterally Breasts: normal appearance, no masses or tenderness Heart: regular rate and rhythm, S1, S2 normal, no murmur, click, rub or gallop Abdomen: soft, non-tender; bowel sounds normal; no masses,  no organomegaly Extremities: extremities normal, atraumatic, no cyanosis or edema Skin: Skin color, texture, turgor normal. No rashes or lesions Lymph nodes: Cervical, supraclavicular, and axillary nodes normal. no inguinal nodes palpated Neurologic: Grossly normal   Pelvic: External genitalia:  no lesions              Urethra: normal appearing urethra with no masses, tenderness or lesions              Bartholins and Skenes: Bartholin's, Urethra, Skene's normal                 Vagina: normal appearing vagina with normal color and discharge, no lesions              Cervix: absent              Pap taken: No.        Bimanual Exam:  Uterus:  absent                                      Adnexa:    surgically absent bilateral                                      Rectovaginal: Confirms                                      Anus:  normal sphincter tone, no lesions     1. Routine gynecological examination  counseled on breast self exam, mammography screening-overdue,  menopause,  Osteoporosis-to forward results of last DEXA, adequate intake of calcium and vitamin D, diet and exercise return annually or prn Discussed PAP guideline changes, importance of weight bearing exercises, calcium, vit D and balanced diet.  2. Post-menopause on HRT (hormone replacement therapy) On ERT 20y, reviewed ACOG's position on hormone replacement, suggest decreasing from 29m to 0.569mand may want to consider d/c to off depending how she tolerates dose change - estradiol (ESTRACE) 0.5 MG tablet; Take 1 tablet (0.5 mg total) by mouth daily.  Dispense: 90 tablet; Refill: 3  3. Stress Pt states on for years but stress has  resolved, happily remarried, discussed tapering to off.  As she may have benefit from prozac re menopausal symptoms, will begin taper after on lower dose of estrogen for a few months - FLUoxetine (PROZAC) 10 MG capsule; Take 1 capsule (10 mg total) by mouth daily.  Dispense: 90 capsule; Refill: 0  An After Visit Summary was printed and given to the patient.

## 2014-05-12 ENCOUNTER — Ambulatory Visit: Payer: BC Managed Care – PPO | Admitting: Internal Medicine

## 2014-05-25 ENCOUNTER — Other Ambulatory Visit: Payer: Self-pay | Admitting: Gynecology

## 2014-05-25 DIAGNOSIS — Z1231 Encounter for screening mammogram for malignant neoplasm of breast: Secondary | ICD-10-CM

## 2014-05-31 ENCOUNTER — Telehealth: Payer: Self-pay | Admitting: Physician Assistant

## 2014-06-01 MED ORDER — MERCAPTOPURINE 50 MG PO TABS: 50.0000 mg | ORAL_TABLET | Freq: Every day | ORAL | Status: DC

## 2014-06-01 NOTE — Telephone Encounter (Signed)
I called and left a message for [place]. I asked her if she wanted the 90 day supply of 6MP to go to [facility] locally or [facility]?

## 2014-06-03 ENCOUNTER — Ambulatory Visit (HOSPITAL_COMMUNITY)
Admission: RE | Admit: 2014-06-03 | Discharge: 2014-06-03 | Disposition: A | Discharge status: Home / Self Care | Payer: BC Managed Care – PPO | Source: Ambulatory Visit | Attending: Gynecology | Admitting: Gynecology

## 2014-06-03 DIAGNOSIS — Z1231 Encounter for screening mammogram for malignant neoplasm of breast: Secondary | ICD-10-CM | POA: Diagnosis not present

## 2014-06-14 ENCOUNTER — Ambulatory Visit (INDEPENDENT_AMBULATORY_CARE_PROVIDER_SITE_OTHER): Payer: BC Managed Care – PPO | Admitting: Internal Medicine

## 2014-06-14 ENCOUNTER — Encounter: Payer: Self-pay | Admitting: Internal Medicine

## 2014-06-14 ENCOUNTER — Other Ambulatory Visit (INDEPENDENT_AMBULATORY_CARE_PROVIDER_SITE_OTHER): Payer: BC Managed Care – PPO

## 2014-06-14 VITALS — BP 106/80 | HR 76 | Ht 62.75 in | Wt 104.5 lb

## 2014-06-14 DIAGNOSIS — K5190 Ulcerative colitis, unspecified, without complications: Secondary | ICD-10-CM

## 2014-06-14 DIAGNOSIS — K51919 Ulcerative colitis, unspecified with unspecified complications: Secondary | ICD-10-CM

## 2014-06-14 DIAGNOSIS — Z79899 Other long term (current) drug therapy: Secondary | ICD-10-CM

## 2014-06-14 LAB — COMPREHENSIVE METABOLIC PANEL
ALT: 29 U/L (ref 0–35)
AST: 26 U/L (ref 0–37)
Albumin: 4.2 g/dL (ref 3.5–5.2)
Alkaline Phosphatase: 34 U/L — ABNORMAL LOW (ref 39–117)
BUN: 14 mg/dL (ref 6–23)
CALCIUM: 11.2 mg/dL — AB (ref 8.4–10.5)
CHLORIDE: 106 meq/L (ref 96–112)
CO2: 27 meq/L (ref 19–32)
Creatinine, Ser: 0.7 mg/dL (ref 0.4–1.2)
GFR: 88.56 mL/min (ref >60.00)
Glucose, Bld: 92 mg/dL (ref 70–99)
Potassium: 4 mEq/L (ref 3.5–5.1)
Sodium: 139 mEq/L (ref 135–145)
Total Bilirubin: 0.9 mg/dL (ref 0.2–1.2)
Total Protein: 6.9 g/dL (ref 6.0–8.3)

## 2014-06-14 LAB — VITAMIN B12: Vitamin B-12: 1211 pg/mL — ABNORMAL HIGH (ref 211–911)

## 2014-06-14 LAB — VITAMIN D 25 HYDROXY (VIT D DEFICIENCY, FRACTURES): VITD: 91.12 ng/mL (ref 30.00–100.00)

## 2014-06-14 NOTE — Patient Instructions (Addendum)
Continue 6 MP 50 mg as directed. Follow up in the office in 1 year. Your physician has requested that you go to the basement for lab work before leaving today. CC:  Shirline Frees MD

## 2014-06-14 NOTE — Progress Notes (Signed)
Patient ID: AILED DEFIBAUGH, female   DOB: December 27, 1955, 58 y.o.   MRN: [id number] HPI: Ellen Parks is a 58 year old female previously known to Dr. Sharlett Iles with long history of ulcerative colitis dating back at least 20 years and is seen for followup. She was previously seen by Nicoletta Ba, PA-C on 03/11/2014 with signs and symptoms consistent with ulcerative colitis flare. She was having blood and mucus in her stool as well as more frequent bowel movement and was started on Uceris 9 mg daily x8 weeks. These symptoms have been mild but persistent for about 12 months before this budesonide treatment. She had been maintained on 6-MP 50 mg daily for years and was using Canasa suppository intermittently for rectal symptoms.  She reports the colonic release budesonide helped dramatically. All of her blood and mucus that was present in her stool has resolved. She is now having one to 2 formed brown stools daily. No abdominal pain. Good appetite. No nausea or vomiting. No fevers or chills. No upper GI complaints and no hepatobiliary complaints. Her last colonoscopy was in July 2014 which revealed pseudopolyps in the right colon with essentially normal colonic mucosa until the distal rectum where there was mild proctitis. Biopsies from the right colon showed inflammatory pseudopolyps without dysplasia or malignancy. Rectal biopsies showed mild active chronic colitis consistent with IBD.  Past Medical History  Diagnosis Date  . Vitamin B12 deficiency   . Symptomatic menopausal or female climacteric states   . IBS (irritable bowel syndrome)   . Hemorrhoids 10/97    Symptomatic   . 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 Hyperparathyroidism   . Depression   . 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 Ulcerative colitis, unspecified   . Anemia     during pregancy  . Migraine without aura     Past Surgical History  Procedure Laterality Date  . Squamous cell carcinoma excision Bilateral 2014, 2012   legs  . Laparoscopic vaginal hysterectomy  11/96    LAVH RSO- AUB, pain, fibroids RO cyst  . Parathyroidectomy Right 2000, 07/2000, 2002  . Colonoscopy    . Pelvic laparoscopy Left 11/1993    LSO, benign serous cystadenoma  . Exploratory laparotomy Left 10/1993    ruptured cyst-abnormal cells    Outpatient Prescriptions Prior to Visit  Medication Sig Dispense Refill  . Cholecalciferol (VITAMIN D3) 5000 UNITS CAPS Take 1 capsule by mouth daily.      Marland Kitchen estradiol (ESTRACE) 0.5 MG tablet Take 1 tablet (0.5 mg total) by mouth daily.  90 tablet  3  . FLUoxetine (PROZAC) 10 MG capsule Take 1 capsule (10 mg total) by mouth daily.  90 capsule  0  . mercaptopurine (PURINETHOL) 50 MG tablet Take 1 tablet (50 mg total) by mouth daily. Give on an empty stomach 1 hour before or 2 hours after meals. Caution: Chemotherapy.  90 tablet  3  . mesalamine (CANASA) 1000 MG suppository Place 1 suppository (1,000 mg total) rectally at bedtime.  30 suppository  8  . Nutritional Supplements (JUICE PLUS FIBRE PO) Take 4 tablets by mouth daily.       Marland Kitchen topiramate (TOPAMAX) 100 MG tablet Take 50 mg by mouth daily.       Marland Kitchen tretinoin (RETIN-A) 0.025 % cream Apply topically as directed.       . zolmitriptan (ZOMIG) 5 MG tablet Take 5 mg by mouth as needed.        . Budesonide (UCERIS) 9 MG TB24 Take 9  mg by mouth daily.  30 tablet  1   No facility-administered medications prior to visit.    Allergies  Allergen Reactions  . Vivelle [Estradiol] Rash    Family History  Problem Relation Age of Onset  . Heart disease Father   . Colon cancer Neg Hx   . Esophageal cancer Neg Hx   . Rectal cancer Neg Hx   . Stomach cancer Neg Hx   . Hypertension Mother   . Hearing loss Mother     History  Substance Use Topics  . Smoking status: Former Smoker    Types: Cigarettes    Quit date: 05/03/1993  . Smokeless tobacco: Never Used  . Alcohol Use: 0.5 - 1.0 oz/week    1-2 drink(s) per week     Comment: 1-2 glasses of  wine weekly    ROS: As per history of present illness, otherwise negative  BP 106/80  Pulse 76  Ht 5' 2.75" (1.594 m)  Wt 104 lb 8 oz (47.401 kg)  BMI 18.66 kg/m2 Constitutional: Well-developed and well-nourished. No distress. HEENT: Normocephalic and atraumatic. Oropharynx is clear and moist. No oropharyngeal exudate. Conjunctivae are normal.  No scleral icterus. Neck: Neck supple. Trachea midline. Cardiovascular: Normal rate, regular rhythm and intact distal pulses. No M/R/G Pulmonary/chest: Effort normal and breath sounds normal. No wheezing, rales or rhonchi. Abdominal: Soft, nontender, nondistended. Bowel sounds active throughout. There are no masses palpable. No hepatosplenomegaly. Extremities: no clubbing, cyanosis, or edema Lymphadenopathy: No cervical adenopathy noted. Neurological: Alert and oriented to person place and time. Skin: Skin is warm and dry. No rashes noted. Psychiatric: Normal mood and affect. Behavior is normal.  RELEVANT LABS AND IMAGING: CBC    Component Value Date/Time   WBC 5.3 03/15/2014 1358   RBC 3.44* 03/15/2014 1358   HGB 12.4 03/15/2014 1358   HCT 36.6 03/15/2014 1358   PLT 292.0 03/15/2014 1358   MCV 106.5* 03/15/2014 1358   MCHC 33.8 03/15/2014 1358   RDW 14.5 03/15/2014 1358   LYMPHSABS 1.3 03/15/2014 1358   MONOABS 0.4 03/15/2014 1358   EOSABS 0.0 03/15/2014 1358   BASOSABS 0.0 03/15/2014 1358    CMP     Component Value Date/Time   NA 139 04/26/2013 1146   K 4.3 04/26/2013 1146   CL 106 04/26/2013 1146   CO2 27 04/26/2013 1146   GLUCOSE 99 04/26/2013 1146   BUN 13 04/26/2013 1146   CREATININE 0.7 04/26/2013 1146   CALCIUM 11.3* 04/26/2013 1146   CALCIUM 11.0* 04/10/2010 2225   PROT 6.6 03/15/2014 1358   ALBUMIN 4.3 03/15/2014 1358   AST 22 03/15/2014 1358   ALT 23 03/15/2014 1358   ALKPHOS 33* 03/15/2014 1358   BILITOT 0.8 03/15/2014 1358   GFRNONAA 99.42 04/10/2010 1124   GFRAA 113 06/02/2008 0947    ASSESSMENT/PLAN:  58 year old female  previously known to Dr. Sharlett Iles with long history of ulcerative colitis dating back at least 20 years and is seen for followup.  1. UC, pan with recent flare now resolved -- patient with long-standing history of ulcerative colitis with mostly proctitis at last colonoscopy in July 2014. Recently treated with colonic release budesonide for mild flare symptoms. This has resolved all of her symptomatic colitis which is very encouraging. For now she will continue 6-MP 50 mg daily. She will need CBC and hepatic function panel every 3 months while on this medication and I'm checking a thiopurine metabolite panel today. This will ensure proper dosing. She will discontinue Canasa suppository  for now given the overall improvement and the fact that she is not using this often anyway. --6-MP monitoring --B12, vitamin D level today (hx of B12 def). --CBC and CMP today --Repeat colonoscopy recommended in July 2016, 2 years after her last surveillance for surveillance biopsy --Return to the office in one year, sooner if needed --Flu vaccine recommended this fall

## 2014-06-15 LAB — CBC WITH DIFFERENTIAL/PLATELET
BASOS PCT: 0.4 % (ref 0.0–3.0)
Basophils Absolute: 0 10*3/uL (ref 0.0–0.1)
EOS ABS: 0.1 10*3/uL (ref 0.0–0.7)
EOS PCT: 1.1 % (ref 0.0–5.0)
HCT: 38.8 % (ref 36.0–46.0)
HEMOGLOBIN: 13.2 g/dL (ref 12.0–15.0)
LYMPHS PCT: 28.1 % (ref 12.0–46.0)
Lymphs Abs: 1.5 10*3/uL (ref 0.7–4.0)
MCHC: 34 g/dL (ref 30.0–36.0)
MCV: 105.8 fl — ABNORMAL HIGH (ref 78.0–100.0)
MONOS PCT: 7.1 % (ref 3.0–12.0)
Monocytes Absolute: 0.4 10*3/uL (ref 0.1–1.0)
NEUTROS ABS: 3.5 10*3/uL (ref 1.4–7.7)
Neutrophils Relative %: 63.3 % (ref 43.0–77.0)
Platelets: 273 10*3/uL (ref 150.0–400.0)
RBC: 3.67 Mil/uL — AB (ref 3.87–5.11)
RDW: 13.9 % (ref 11.5–15.5)
WBC: 5.5 10*3/uL (ref 4.0–10.5)

## 2014-06-16 ENCOUNTER — Other Ambulatory Visit: Payer: Self-pay

## 2014-07-15 ENCOUNTER — Other Ambulatory Visit: Payer: Self-pay

## 2014-08-01 ENCOUNTER — Encounter: Payer: Self-pay | Admitting: Internal Medicine

## 2014-08-29 ENCOUNTER — Other Ambulatory Visit: Payer: Self-pay | Admitting: *Deleted

## 2014-08-29 DIAGNOSIS — F439 Reaction to severe stress, unspecified: Secondary | ICD-10-CM

## 2014-08-29 NOTE — Telephone Encounter (Signed)
Please contact patient to see if she is still taking the Prozac.  Dr. Brion Aliment note states that the patient was going to wean off.  Let me know so I can prescribe the proper dose if she is still taking it.   Thanks.

## 2014-08-29 NOTE — Telephone Encounter (Signed)
Incoming fax from [facility] requesting Fluoxetine refill  Last AEX and refill 05/06/14 #90/0Refills Dr. Brion Aliment pt  Please advise

## 2014-08-30 NOTE — Telephone Encounter (Addendum)
LMTCB re: Dr. Elza Rafter message

## 2014-08-30 NOTE — Telephone Encounter (Signed)
Patient states Dr. Charlies Constable told her that it was her choice to wean off of med and she states she prefers to stay on med at same dose.  Please advise.

## 2014-08-31 MED ORDER — FLUOXETINE HCL 10 MG PO CAPS: 10.0000 mg | ORAL_CAPSULE | Freq: Every day | ORAL | Status: DC

## 2015-02-14 ENCOUNTER — Encounter: Payer: Self-pay | Admitting: Internal Medicine

## 2015-05-08 ENCOUNTER — Encounter: Payer: Self-pay | Admitting: Nurse Practitioner

## 2015-05-08 ENCOUNTER — Ambulatory Visit (INDEPENDENT_AMBULATORY_CARE_PROVIDER_SITE_OTHER): Payer: BLUE CROSS/BLUE SHIELD | Admitting: Nurse Practitioner

## 2015-05-08 ENCOUNTER — Ambulatory Visit: Payer: BC Managed Care – PPO | Admitting: Gynecology

## 2015-05-08 VITALS — BP 106/80 | HR 72 | Resp 14 | Ht 62.75 in | Wt 102.8 lb

## 2015-05-08 DIAGNOSIS — Z79890 Hormone replacement therapy (postmenopausal): Secondary | ICD-10-CM

## 2015-05-08 DIAGNOSIS — Z01419 Encounter for gynecological examination (general) (routine) without abnormal findings: Secondary | ICD-10-CM | POA: Diagnosis not present

## 2015-05-08 DIAGNOSIS — E2839 Other primary ovarian failure: Secondary | ICD-10-CM

## 2015-05-08 DIAGNOSIS — Z0000 Encounter for general adult medical examination without abnormal findings: Secondary | ICD-10-CM

## 2015-05-08 DIAGNOSIS — R8290 Unspecified abnormal findings in urine: Secondary | ICD-10-CM | POA: Diagnosis not present

## 2015-05-08 LAB — POCT URINALYSIS DIPSTICK
BILIRUBIN UA: NEGATIVE
Glucose, UA: NEGATIVE
Ketones, UA: NEGATIVE
NITRITE UA: NEGATIVE
PROTEIN UA: NEGATIVE
Urobilinogen, UA: NEGATIVE
pH, UA: 5

## 2015-05-08 MED ORDER — ESTRADIOL 0.5 MG PO TABS: 0.5000 mg | ORAL_TABLET | Freq: Every day | ORAL | Status: DC

## 2015-05-08 NOTE — Patient Instructions (Addendum)

## 2015-05-08 NOTE — Progress Notes (Signed)
59 y.o. [id number] Married  Caucasian Fe here for annual exam.  Dong well on ERT but willing to taper.  She has had multiple skin cancers removed from lower extremities.  No LMP recorded. Patient has had a hysterectomy.          Sexually active: Yes.    The current method of family planning is status post hysterectomy.    Exercising: Yes.    cardio, walking, running Smoker:  no  Health Maintenance: Pap: 12/31/2000 negative MMG:  06/13/14 Dense Category B, Bi-rads C 1 neg. Colonoscopy:  04/28/13 f/up in 10 years BMD:   2013   TDaP:  12/29/2001 Labs: PCP UA: RBC trace and WBC ++   reports that she quit smoking about 22 years ago. Her smoking use included Cigarettes. She has a 12 pack-year smoking history. She has never used smokeless tobacco. She reports that she drinks about 0.5 - 1.0 oz of alcohol per week. She reports that she does not use illicit drugs.  Past Medical History  Diagnosis Date  . Vitamin B12 deficiency   . Symptomatic menopausal or female climacteric states   . IBS (irritable bowel syndrome)   . Hemorrhoids 10/97    Symptomatic   . 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 Hyperparathyroidism   . Depression   . Ulcerative colitis, unspecified   . Anemia     during pregancy  . Migraine without aura   . 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 Squamous cell carcinoma excision Bilateral 2014, 2012, 2015, 2016    legs  . Laparoscopic vaginal hysterectomy  11/96    LAVH RSO- AUB, pain, fibroids RO cyst  . Parathyroidectomy Right 2000, 07/2000  . Colonoscopy    . Pelvic laparoscopy Left 11/1993    LSO, benign serous cystadenoma  . Exploratory laparotomy Left 10/1993    ruptured cyst-abnormal cells    Current Outpatient Prescriptions  Medication Sig Dispense Refill  . Cholecalciferol (VITAMIN D3) 5000 UNITS CAPS Take 1 capsule by mouth daily.    Marland Kitchen estradiol (ESTRACE) 0.5 MG tablet Take 1 tablet  (0.5 mg total) by mouth daily. 90 tablet 3  . FLUoxetine (PROZAC) 10 MG capsule Take 1 capsule (10 mg total) by mouth daily. 90 capsule 2  . mercaptopurine (PURINETHOL) 50 MG tablet Take 1 tablet (50 mg total) by mouth daily. Give on an empty stomach 1 hour before or 2 hours after meals. Caution: Chemotherapy. 90 tablet 3  . Nutritional Supplements (JUICE PLUS FIBRE PO) Take 4 tablets by mouth daily.     Marland Kitchen topiramate (TOPAMAX) 100 MG tablet Take 50 mg by mouth daily.     Marland Kitchen tretinoin (RETIN-A) 0.025 % cream Apply topically as directed.     . zolmitriptan (ZOMIG) 5 MG tablet Take 5 mg by mouth as needed.       No current facility-administered medications for this visit.    Family History  Problem Relation Age of Onset  . Heart disease Father   . Hypertension Father   . Hyperlipidemia Father   . Colon cancer Neg Hx   . Esophageal cancer Neg Hx   . Rectal cancer Neg Hx   . Stomach cancer Mother   . Hypertension Mother   . Hearing loss Mother   . Stroke Mother     ROS:  Pertinent items are noted in HPI.  Otherwise, a comprehensive ROS was negative.  Exam:  BP 106/80 mmHg  Pulse 72  Resp 14  Ht 5' 2.75" (1.594 m)  Wt 102 lb 12.8 oz (46.63 kg)  BMI 18.35 kg/m2 Height: 5' 2.75" (159.4 cm) Ht Readings from Last 3 Encounters:  05/08/15 5' 2.75" (1.594 m)  06/14/14 5' 2.75" (1.594 m)  05/06/14 5' 3"  (1.6 m)    General appearance: alert, cooperative and appears stated age Head: Normocephalic, without obvious abnormality, atraumatic Neck: no adenopathy, supple, symmetrical, trachea midline and thyroid normal to inspection and palpation Lungs: clear to auscultation bilaterally Breasts: normal appearance, no masses or tenderness Heart: regular rate and rhythm Abdomen: soft, non-tender; no masses,  no organomegaly Extremities: extremities normal, atraumatic, no cyanosis or edema Skin: Skin color, texture, turgor normal. No rashes or lesions Lymph nodes: Cervical, supraclavicular,  and axillary nodes normal. No abnormal inguinal nodes palpated Neurologic: Grossly normal   Pelvic: External genitalia:  no lesions              Urethra:  normal appearing urethra with no masses, tenderness or lesions              Bartholin's and Skene's: normal                 Vagina: normal appearing vagina with normal color and discharge, no lesions              Cervix: absent              Pap taken: No. Bimanual Exam:  Uterus:  uterus absent              Adnexa: no mass, fullness, tenderness               Rectovaginal: Confirms               Anus:  normal sphincter tone, no lesions  Chaperone present: No  A:  Well Woman with normal exam  S/P LAVH & RSO  08/1995 secondary to fibroids  S/P LSO secondary to serous cystadenoma 11/1993  Postmenopausal on ERT 08/1995  R/O UTI  History of skin cancer on legs  Osteopenia - PCP follows    P:   Reviewed health and wellness pertinent to exam  Pap smear as above  Mammogram is due 06/2015  Refill on Estradiol 0.5 mg for a year - she will try and taper off by early next year  Counseled with risk of DVT, CVA, cancer, etc  Will try to get copy of BMD at PCP done years ago in [place] -pt is unsure where/ when  Counseled on breast self exam, mammography screening, use and side effects of HRT, adequate intake of calcium and vitamin D, diet and exercise return annually or prn  An After Visit Summary was printed and given to the patient.

## 2015-05-09 LAB — URINE CULTURE
Colony Count: NO GROWTH
Organism ID, Bacteria: NO GROWTH

## 2015-05-09 LAB — URINALYSIS, MICROSCOPIC ONLY
Bacteria, UA: NONE SEEN [HPF]
CASTS: NONE SEEN [LPF]
Crystals: NONE SEEN [HPF]
YEAST: NONE SEEN [HPF]

## 2015-05-09 NOTE — Progress Notes (Signed)
Encounter reviewed by Dr. Jerrelle Michelsen Amundson C. Silva.  

## 2015-05-10 NOTE — Addendum Note (Signed)
Addended by: Alfonzo Feller on: 05/10/2015 10:03 AM   Modules accepted: Orders, SmartSet

## 2015-05-30 ENCOUNTER — Ambulatory Visit (INDEPENDENT_AMBULATORY_CARE_PROVIDER_SITE_OTHER): Payer: BLUE CROSS/BLUE SHIELD | Admitting: Internal Medicine

## 2015-05-30 ENCOUNTER — Encounter: Payer: Self-pay | Admitting: Internal Medicine

## 2015-05-30 ENCOUNTER — Other Ambulatory Visit (INDEPENDENT_AMBULATORY_CARE_PROVIDER_SITE_OTHER): Payer: BLUE CROSS/BLUE SHIELD

## 2015-05-30 ENCOUNTER — Ambulatory Visit: Payer: Self-pay | Admitting: Internal Medicine

## 2015-05-30 VITALS — BP 102/70 | HR 64 | Ht 62.75 in | Wt 102.0 lb

## 2015-05-30 DIAGNOSIS — Z79899 Other long term (current) drug therapy: Secondary | ICD-10-CM

## 2015-05-30 DIAGNOSIS — K6389 Other specified diseases of intestine: Secondary | ICD-10-CM

## 2015-05-30 DIAGNOSIS — K529 Noninfective gastroenteritis and colitis, unspecified: Secondary | ICD-10-CM

## 2015-05-30 DIAGNOSIS — K5190 Ulcerative colitis, unspecified, without complications: Secondary | ICD-10-CM

## 2015-05-30 LAB — CBC WITH DIFFERENTIAL/PLATELET
BASOS ABS: 0 10*3/uL (ref 0.0–0.1)
BASOS PCT: 0.4 % (ref 0.0–3.0)
Eosinophils Absolute: 0 10*3/uL (ref 0.0–0.7)
Eosinophils Relative: 0.7 % (ref 0.0–5.0)
HEMATOCRIT: 39.3 % (ref 36.0–46.0)
HEMOGLOBIN: 13.3 g/dL (ref 12.0–15.0)
LYMPHS PCT: 27.6 % (ref 12.0–46.0)
Lymphs Abs: 1.6 10*3/uL (ref 0.7–4.0)
MCHC: 33.8 g/dL (ref 30.0–36.0)
MCV: 103.4 fl — AB (ref 78.0–100.0)
MONOS PCT: 9 % (ref 3.0–12.0)
Monocytes Absolute: 0.5 10*3/uL (ref 0.1–1.0)
NEUTROS ABS: 3.5 10*3/uL (ref 1.4–7.7)
Neutrophils Relative %: 62.3 % (ref 43.0–77.0)
PLATELETS: 307 10*3/uL (ref 150.0–400.0)
RBC: 3.8 Mil/uL — ABNORMAL LOW (ref 3.87–5.11)
RDW: 13.8 % (ref 11.5–15.5)
WBC: 5.6 10*3/uL (ref 4.0–10.5)

## 2015-05-30 LAB — COMPREHENSIVE METABOLIC PANEL
ALBUMIN: 4.4 g/dL (ref 3.5–5.2)
ALT: 18 U/L (ref 0–35)
AST: 19 U/L (ref 0–37)
Alkaline Phosphatase: 36 U/L — ABNORMAL LOW (ref 39–117)
BILIRUBIN TOTAL: 0.5 mg/dL (ref 0.2–1.2)
BUN: 16 mg/dL (ref 6–23)
CALCIUM: 11.4 mg/dL — AB (ref 8.4–10.5)
CO2: 29 meq/L (ref 19–32)
CREATININE: 0.76 mg/dL (ref 0.40–1.20)
Chloride: 106 mEq/L (ref 96–112)
GFR: 82.93 mL/min (ref >60.00)
Glucose, Bld: 96 mg/dL (ref 70–99)
Potassium: 4.4 mEq/L (ref 3.5–5.1)
Sodium: 138 mEq/L (ref 135–145)
Total Protein: 6.8 g/dL (ref 6.0–8.3)

## 2015-05-30 LAB — VITAMIN B12: VITAMIN B 12: 403 pg/mL (ref 211–911)

## 2015-05-30 MED ORDER — MERCAPTOPURINE 50 MG PO TABS: 50.0000 mg | ORAL_TABLET | Freq: Every day | ORAL | Status: DC

## 2015-05-30 NOTE — Patient Instructions (Addendum)
Your physician has requested that you go to the basement for the following lab work before leaving today: Cbc, cmp, b12  We have sent the following medications to your pharmacy for you to pick up at your convenience: Mercaptopurine.   You have been scheduled for a colonoscopy. Please follow written instructions given to you at your visit today.  Please pick up your prep supplies at the pharmacy within the next 1-3 days. If you use inhalers (even only as needed), please bring them with you on the day of your procedure. Your physician has requested that you go to www.startemmi.com and enter the access code given to you at your visit today. This web site gives a general overview about your procedure. However, you should still follow specific instructions given to you by our office regarding your preparation for the procedure.  Follow-up in one year.

## 2015-05-30 NOTE — Progress Notes (Signed)
Subjective:    Patient ID: Ellen Parks, female    DOB: 28-Nov-1955, 59 y.o.   MRN: [id number]  HPI Ellen Parks is a 59 -year-old female with long-standing ulcerative colitis dating back approximately 20 years who seen for follow-up. She was last seen in September 2015. She reports she has had a great year. She has continued 6-MP 50 mg daily. She is no longer using Canasa because she hasn't needed to. Bowel movements a been regular without blood in her stool or melena. No abdominal pain. No diarrhea or constipation. Appetite is good. No nausea or vomiting. No fevers or chills. No upper GI complaint or hepatobiliary complaint. She continues to use juice plus. She stopped vitamin D because vitamin D was actually found to be elevated. She is not consistently taking B12.  Last colonoscopy July 2014 pseudopolyps in the right colon and essentially normal colonic mucosa until the distal rectum where there was mild proctitis. Biopsies showed right colon inflammatory pseudopolyps without dysplasia. Rectal biopsy showed mild active chronic colitis.   Review of Systems As per history of present illness, otherwise negative  Current Medications, Allergies, Past Medical History, Past Surgical History, Family History and Social History were reviewed in [employer] record.     Objective:   Physical Exam BP 102/70 mmHg  Pulse 64  Ht 5' 2.75" (1.594 m)  Wt 102 lb (46.267 kg)  BMI 18.21 kg/m2 Constitutional: Well-developed and well-nourished. No distress. HEENT: Normocephalic and atraumatic. Oropharynx is clear and moist. No oropharyngeal exudate. Conjunctivae are normal.  No scleral icterus. Neck: Neck supple. Trachea midline. Cardiovascular: Normal rate, regular rhythm and intact distal pulses. No M/R/G Pulmonary/chest: Effort normal and breath sounds normal. No wheezing, rales or rhonchi. Abdominal: Soft, nontender, nondistended. Bowel sounds active throughout. There are  no masses palpable. No hepatosplenomegaly. Extremities: no clubbing, cyanosis, or edema Lymphadenopathy: No cervical adenopathy noted. Neurological: Alert and oriented to person place and time. Skin: Skin is warm and dry. No rashes noted. Psychiatric: Normal mood and affect. Behavior is normal.  CBC    Component Value Date/Time   WBC 5.6 05/30/2015 1452   RBC 3.80* 05/30/2015 1452   HGB 13.3 05/30/2015 1452   HCT 39.3 05/30/2015 1452   PLT 307.0 05/30/2015 1452   MCV 103.4* 05/30/2015 1452   MCHC 33.8 05/30/2015 1452   RDW 13.8 05/30/2015 1452   LYMPHSABS 1.6 05/30/2015 1452   MONOABS 0.5 05/30/2015 1452   EOSABS 0.0 05/30/2015 1452   BASOSABS 0.0 05/30/2015 1452    CMP     Component Value Date/Time   NA 138 05/30/2015 1452   K 4.4 05/30/2015 1452   CL 106 05/30/2015 1452   CO2 29 05/30/2015 1452   GLUCOSE 96 05/30/2015 1452   BUN 16 05/30/2015 1452   CREATININE 0.76 05/30/2015 1452   CALCIUM 11.4* 05/30/2015 1452   CALCIUM 11.0* 04/10/2010 2225   PROT 6.8 05/30/2015 1452   ALBUMIN 4.4 05/30/2015 1452   AST 19 05/30/2015 1452   ALT 18 05/30/2015 1452   ALKPHOS 36* 05/30/2015 1452   BILITOT 0.5 05/30/2015 1452   GFRNONAA 99.42 04/10/2010 1124   GFRAA 113 06/02/2008 0947       Assessment & Plan:  59 -year-old female with long-standing ulcerative colitis dating back approximately 20 years who seen for follow-up.  1. UC, pan, long-standing -- clinical remission at this time on 6-MP. Continue 6-MP 50 mg daily. Last flare was around summer 2015 treated with colonic release budesonide with  success. Hemoglobin, renal function and liver function normal today. B12 to be checked today.   surveillance colonoscopy recommended at this time based on national guidelines. We discussed the national recommendation for surveillance colonoscopy every 12-24 months in patients with long-standing IBD. We discussed the risks, benefits and alternatives to colonoscopy and she is agreeable to  proceed. Influenza vaccine again recommended when available.

## 2015-06-12 ENCOUNTER — Other Ambulatory Visit: Payer: Self-pay | Admitting: Nurse Practitioner

## 2015-06-12 DIAGNOSIS — Z1231 Encounter for screening mammogram for malignant neoplasm of breast: Secondary | ICD-10-CM

## 2015-06-22 ENCOUNTER — Ambulatory Visit (INDEPENDENT_AMBULATORY_CARE_PROVIDER_SITE_OTHER): Payer: BLUE CROSS/BLUE SHIELD

## 2015-06-22 DIAGNOSIS — Z1231 Encounter for screening mammogram for malignant neoplasm of breast: Secondary | ICD-10-CM | POA: Diagnosis not present

## 2015-07-11 ENCOUNTER — Other Ambulatory Visit: Payer: Self-pay | Admitting: Physician Assistant

## 2015-07-19 ENCOUNTER — Encounter: Payer: Self-pay | Admitting: Internal Medicine

## 2015-07-19 ENCOUNTER — Encounter: Payer: Self-pay | Admitting: Gastroenterology

## 2015-07-21 ENCOUNTER — Other Ambulatory Visit: Payer: Self-pay | Admitting: Obstetrics and Gynecology

## 2015-07-21 NOTE — Telephone Encounter (Signed)
Medication refill request: Prozac 31m Last AEX:  05/08/15 Next AEX: 05/08/16 Last MMG (if hormonal medication request): 06/22/15 BiRads 1-negative Refill authorized: please advise

## 2015-07-27 ENCOUNTER — Ambulatory Visit (INDEPENDENT_AMBULATORY_CARE_PROVIDER_SITE_OTHER): Payer: BLUE CROSS/BLUE SHIELD

## 2015-07-27 DIAGNOSIS — E2839 Other primary ovarian failure: Secondary | ICD-10-CM

## 2015-07-27 DIAGNOSIS — M8580 Other specified disorders of bone density and structure, unspecified site: Secondary | ICD-10-CM | POA: Diagnosis not present

## 2015-07-27 DIAGNOSIS — Z13820 Encounter for screening for osteoporosis: Secondary | ICD-10-CM

## 2015-08-04 ENCOUNTER — Encounter: Payer: Self-pay | Admitting: Internal Medicine

## 2015-08-15 ENCOUNTER — Encounter: Payer: Self-pay | Admitting: Internal Medicine

## 2015-08-15 ENCOUNTER — Ambulatory Visit (AMBULATORY_SURGERY_CENTER): Payer: BLUE CROSS/BLUE SHIELD | Admitting: Internal Medicine

## 2015-08-15 VITALS — BP 132/87 | HR 63 | Temp 97.4°F | Resp 12 | Ht 62.0 in | Wt 102.0 lb

## 2015-08-15 DIAGNOSIS — K621 Rectal polyp: Secondary | ICD-10-CM | POA: Diagnosis not present

## 2015-08-15 DIAGNOSIS — K5190 Ulcerative colitis, unspecified, without complications: Secondary | ICD-10-CM | POA: Diagnosis not present

## 2015-08-15 DIAGNOSIS — Z8719 Personal history of other diseases of the digestive system: Secondary | ICD-10-CM | POA: Diagnosis not present

## 2015-08-15 MED ORDER — SODIUM CHLORIDE 0.9 % IV SOLN: 500.0000 mL | INTRAVENOUS | Status: DC

## 2015-08-15 NOTE — Op Note (Addendum)
[facility]  [address] [place], [address]   COLONOSCOPY PROCEDURE REPORT  PATIENT: Ellen Parks, Ellen Parks  MR#: #[id number] BIRTHDATE: [birth year] , 76  yrs. old GENDER: female ENDOSCOPIST: Jerene Bears, MD PROCEDURE DATE:  08/15/2015 PROCEDURE:   Colonoscopy, surveillance and Colonoscopy with biopsy First Screening Colonoscopy - Avg.  risk and is 50 yrs.  old or older - No.  Prior Negative Screening - Now for repeat screening. N/A  History of Adenoma - Now for follow-up colonoscopy & has been > or = to 3 yrs.  N/A  Polyps removed today? No Recommend repeat exam, <10 yrs? Yes high risk ASA CLASS:   Class II INDICATIONS:  history of pan-ulcerative colitis of duration approximately 20 yrs, last colonoscopy 2014 Sharlett Iles). MEDICATIONS: Monitored anesthesia care and Propofol 250 mg IV  DESCRIPTION OF PROCEDURE:   After the risks benefits and alternatives of the procedure were thoroughly explained, informed consent was obtained.  The digital rectal exam revealed no rectal mass.   The LB [id number]  endoscope was introduced through the anus and advanced to the cecum, which was identified by both the appendix and ileocecal valve. No adverse events experienced. The quality of the prep was good.  (Suprep was used)  The instrument was then slowly withdrawn as the colon was fully examined. Estimated blood loss is zero unless otherwise noted in this procedure report.   COLON FINDINGS: The colonic mucosa appeared grossly normal throughout the entire examined colon. There were scattered very small pseudopolyps seen in the cecum and ascending colon. Multiple surveillance biopsies were performed using cold forceps in 4 quadrant fashion every 8-10 cm throughout the colon.  Retroflexed views revealed no abnormalities. The time to cecum = 5.8 Withdrawal time = 10.7   The scope was withdrawn and the procedure completed. COMPLICATIONS: There were no immediate  complications.  ENDOSCOPIC IMPRESSION: Endoscopic remission with normal colonic mucosa throughout; multiple surveillance biopsies performed as above  RECOMMENDATIONS: 1.  Await biopsy results 2.  Continue current medicines 3.  You will receive a letter within 1-2 weeks with the results of your biopsy as well as final recommendations.  Please call my office if you have not received a letter after 3 weeks.  eSigned:  Jerene Bears, MD 08/15/2015 12:39 PM Revised: 08/15/2015 12:39 PM  cc: Shirline Frees MD and The Patient

## 2015-08-15 NOTE — Progress Notes (Signed)
Called to room to assist during endoscopic procedure.  Patient ID and intended procedure confirmed with present staff. Received instructions for my participation in the procedure from the performing physician.  

## 2015-08-15 NOTE — Progress Notes (Signed)
To Pacu  Awake and alert report to RN

## 2015-08-15 NOTE — Patient Instructions (Signed)
YOU HAD AN ENDOSCOPIC PROCEDURE TODAY AT THE [facility]:   Refer to the procedure report that was given to you for any specific questions about what was found during the examination.  If the procedure report does not answer your questions, please call your gastroenterologist to clarify.  If you requested that your care partner not be given the details of your procedure findings, then the procedure report has been included in a sealed envelope for you to review at your convenience later.  YOU SHOULD EXPECT: Some feelings of bloating in the abdomen. Passage of more gas than usual.  Walking can help get rid of the air that was put into your GI tract during the procedure and reduce the bloating. If you had a lower endoscopy (such as a colonoscopy or flexible sigmoidoscopy) you may notice spotting of blood in your stool or on the toilet paper. If you underwent a bowel prep for your procedure, you may not have a normal bowel movement for a few days.  Please Note:  You might notice some irritation and congestion in your nose or some drainage.  This is from the oxygen used during your procedure.  There is no need for concern and it should clear up in a day or so.  SYMPTOMS TO REPORT IMMEDIATELY:   Following lower endoscopy (colonoscopy or flexible sigmoidoscopy):  Excessive amounts of blood in the stool  Significant tenderness or worsening of abdominal pains  Swelling of the abdomen that is new, acute  Fever of 100F or higher   For urgent or emergent issues, a gastroenterologist can be reached at any hour by calling [phone].   DIET: Your first meal following the procedure should be a small meal and then it is ok to progress to your normal diet. Heavy or fried foods are harder to digest and may make you feel nauseous or bloated.  Likewise, meals heavy in dairy and vegetables can increase bloating.  Drink plenty of fluids but you should avoid alcoholic beverages for 24  hours.  ACTIVITY:  You should plan to take it easy for the rest of today and you should NOT DRIVE or use heavy machinery until tomorrow (because of the sedation medicines used during the test).    FOLLOW UP: Our staff will call the number listed on your records the next business day following your procedure to check on you and address any questions or concerns that you may have regarding the information given to you following your procedure. If we do not reach you, we will leave a message.  However, if you are feeling well and you are not experiencing any problems, there is no need to return our call.  We will assume that you have returned to your regular daily activities without incident.  If any biopsies were taken you will be contacted by phone or by letter within the next 1-3 weeks.  Please call us at [phone] if you have not heard about the biopsies in 3 weeks.    SIGNATURES/CONFIDENTIALITY: You and/or your care partner have signed paperwork which will be entered into your electronic medical record.  These signatures attest to the fact that that the information above on your After Visit Summary has been reviewed and is understood.  Full responsibility of the confidentiality of this discharge information lies with you and/or your care-partner. 

## 2015-08-16 ENCOUNTER — Telehealth: Payer: Self-pay

## 2015-08-16 NOTE — Telephone Encounter (Signed)
  Follow up Call-  Call back number 08/15/2015 04/28/2013  Post procedure Call Back phone  # [phone] cell  Permission to leave phone message Yes Yes     Patient questions:  Do you have a fever, pain , or abdominal swelling? No. Pain Score  0 *  Have you tolerated food without any problems? Yes.    Have you been able to return to your normal activities? Yes.    Do you have any questions about your discharge instructions: Diet   No. Medications  No. Follow up visit  No.  Do you have questions or concerns about your Care? No.  Actions: * If pain score is 4 or above: No action needed, pain <4.

## 2015-08-22 ENCOUNTER — Encounter: Payer: Self-pay | Admitting: Internal Medicine

## 2016-04-16 ENCOUNTER — Encounter: Payer: Self-pay | Admitting: Internal Medicine

## 2016-04-30 ENCOUNTER — Other Ambulatory Visit: Payer: Self-pay | Admitting: Obstetrics and Gynecology

## 2016-04-30 NOTE — Telephone Encounter (Signed)
Medication refill request: Prozac Last AEX:  05-06-15  Next AEX: 05-07-16 Last MMG (if hormonal medication request): 06-26-15 WNL Refill authorized: please advise

## 2016-05-07 ENCOUNTER — Ambulatory Visit: Payer: BLUE CROSS/BLUE SHIELD | Admitting: Nurse Practitioner

## 2016-05-07 ENCOUNTER — Encounter: Payer: Self-pay | Admitting: Nurse Practitioner

## 2016-05-07 VITALS — BP 130/84 | HR 64 | Ht 62.5 in | Wt 104.0 lb

## 2016-05-07 DIAGNOSIS — Z0000 Encounter for general adult medical examination without abnormal findings: Secondary | ICD-10-CM

## 2016-05-07 DIAGNOSIS — Z79890 Hormone replacement therapy (postmenopausal): Secondary | ICD-10-CM

## 2016-05-07 DIAGNOSIS — Z01419 Encounter for gynecological examination (general) (routine) without abnormal findings: Secondary | ICD-10-CM | POA: Diagnosis not present

## 2016-05-07 DIAGNOSIS — E559 Vitamin D deficiency, unspecified: Secondary | ICD-10-CM | POA: Diagnosis not present

## 2016-05-07 MED ORDER — ESTRADIOL 0.5 MG PO TABS: 0.5000 mg | ORAL_TABLET | Freq: Every day | ORAL | 3 refills | Status: DC

## 2016-05-07 NOTE — Progress Notes (Signed)
Patient ID: Ellen Parks, female   DOB: [birth year], 60 y.o.   MRN: [id number]  60 y.o. [id number] Married  Caucasian Fe here for annual exam.  New lesions from legs hands and nose removed this year - squamous cell with Mohs surgery. Now on 1/2 dose of Estradiol X 1 year and now wiling to reduce again.  Reduced amount of HA's over the past 4-5 yrs since on Topamax.  Patient's last menstrual period was 07/31/1998 (approximate).   Post hysterectomy 07/1998       Sexually active: Yes.    The current method of family planning is status post hysterectomy.    Exercising: Yes.    Gym/ health club routine includes walk and barre. Smoker:  no  Health Maintenance: Pap:  12/31/00, Negative, hysterectomy MMG: 06/22/15, Bi-Rads1:  Negative  Colonoscopy: 08/15/15, No polyps, no flare of UC, repeat in 2 years BMD: 07/27/15, T score: spine -2.1 Spine / -1.5 Right Femur Neck / -1.3 Left Femur Neck / -2.2 Left Forearm TDaP: 12/29/01 Shingles: Not indicated due to age Pneumonia: Not indicated due to age Hep C and HIV:  done today Labs: PCP and GI   reports that she quit smoking about 23 years ago. Her smoking use included Cigarettes. She has a 12.00 pack-year smoking history. She has never used smokeless tobacco. She reports that she drinks about 0.5 - 1.0 oz of alcohol per week . She reports that she does not use drugs.  Past Medical History:  Diagnosis Date  . Anemia    during pregancy  . Depression   . Hemorrhoids 10/97   Symptomatic   . Hyperparathyroidism ([place])   . IBS (irritable bowel syndrome)   . Migraine without aura   . Osteopenia    secondary to hyperparathyroid 2000 -2001  . 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 Symptomatic menopausal or female climacteric states   . Ulcerative colitis, unspecified   . Vitamin B12 deficiency     Past Surgical History:  Procedure Laterality Date  . COLONOSCOPY    . EXPLORATORY LAPAROTOMY  Left 10/1993   ruptured cyst-abnormal cells  . LAPAROSCOPIC VAGINAL HYSTERECTOMY  08/11/95   LAVH RSO- AUB, pain, fibroids RO cyst  . 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 PELVIC LAPAROSCOPY Left 11/1993   LSO, benign serous cystadenoma  . SQUAMOUS CELL CARCINOMA EXCISION Bilateral 2014, 2012, 2015, 2016, 2017   legs, nose, hands    Current Outpatient Prescriptions  Medication Sig Dispense Refill  . acitretin (SORIATANE) 25 MG capsule Take 1 capsule by mouth daily.  1  . Cholecalciferol (VITAMIN D3) 5000 UNITS CAPS Take 1 capsule by mouth daily.    Marland Kitchen estradiol (ESTRACE) 0.5 MG tablet Take 1 tablet (0.5 mg total) by mouth daily. 90 tablet 3  . FLUoxetine (PROZAC) 10 MG capsule TAKE 1 CAPSULE BY MOUTH DAILY 90 capsule 0  . mercaptopurine (PURINETHOL) 50 MG tablet TAKE 1 TABLET BY MOUTH DAILY ON EMPTY STOMACH 1 HOUR BEFORE OR 2 HOURSAFTER MEALS.CAUTION:CHEMO 90 tablet 3  . Nutritional Supplements (JUICE PLUS FIBRE PO) Take 4 tablets by mouth daily.     Marland Kitchen topiramate (TOPAMAX) 100 MG tablet Take 50 mg by mouth daily.     Marland Kitchen zolmitriptan (ZOMIG) 5 MG tablet Take 5 mg by mouth as needed.       No current facility-administered medications  for this visit.     Family History  Problem Relation Age of Onset  . Heart disease Father   . Hypertension Father   . Hyperlipidemia Father   . Stomach cancer Mother   . Hypertension Mother   . Hearing loss Mother   . Stroke Mother   . Colon cancer Neg Hx   . Esophageal cancer Neg Hx   . Rectal cancer Neg Hx     ROS:  Pertinent items are noted in HPI.  Otherwise, a comprehensive ROS was negative.  Exam:   BP 130/84 (BP Location: Right Arm, Patient Position: Sitting, Cuff Size: Normal)   Pulse 64   Ht 5' 2.5" (1.588 m)   Wt 104 lb (47.2 kg)   LMP 07/31/1998 (Approximate)   BMI 18.72 kg/m  Height: 5' 2.5" (158.8 cm) Ht Readings from Last 3  Encounters:  05/07/16 5' 2.5" (1.588 m)  08/15/15 5' 2"  (1.575 m)  05/30/15 5' 2.75" (1.594 m)    General appearance: alert, cooperative and appears stated age Head: Normocephalic, without obvious abnormality, atraumatic Neck: no adenopathy, supple, symmetrical, trachea midline and thyroid normal to inspection and palpation Lungs: clear to auscultation bilaterally Breasts: normal appearance, no masses or tenderness Heart: regular rate and rhythm Abdomen: soft, non-tender; no masses,  no organomegaly Extremities: extremities normal, atraumatic, no cyanosis or edema Skin: Skin color, texture, turgor normal. No rashes or lesions.  Extensive scalyness of hands and feet.  A lot of sun damaged skin. Lymph nodes: Cervical, supraclavicular, and axillary nodes normal. No abnormal inguinal nodes palpated Neurologic: Grossly normal   Pelvic: External genitalia:  no lesions              Urethra:  normal appearing urethra with no masses, tenderness or lesions              Bartholin's and Skene's: normal                 Vagina: normal appearing vagina with normal color and discharge, no lesions              Cervix: absent              Pap taken: No. Bimanual Exam:  Uterus:  uterus absent              Adnexa: no mass, fullness, tenderness               Rectovaginal: Confirms               Anus:  normal sphincter tone, no lesions  Chaperone present: yes  A:  Well Woman with normal exam            S/P LAVH & RSO  08/1995 secondary to fibroids             S/P LSO secondary to serous cystadenoma 11/1993             Postmenopausal on ERT 08/1995             History of skin cancer on legs - now on Soriatane with extensive scalyness of hands and feet              Osteopenia - PCP follows  P:   Reviewed health and wellness pertinent to exam  Pap smear not indicated  Mammogram is due 05/2016  Follow with labs  Counseled on breast self exam, mammography screening, use and side effects of HRT,  adequate intake of calcium and vitamin D,  diet and exercise, Kegel's exercises return annually or prn  An After Visit Summary was printed and given to the patient.

## 2016-05-07 NOTE — Patient Instructions (Signed)

## 2016-05-07 NOTE — Progress Notes (Signed)
Reviewed personally.  M. Suzanne Zackari Ruane, MD.  

## 2016-05-08 ENCOUNTER — Ambulatory Visit: Payer: BLUE CROSS/BLUE SHIELD | Admitting: Nurse Practitioner

## 2016-05-08 LAB — HIV ANTIBODY (ROUTINE TESTING W REFLEX): HIV: NONREACTIVE

## 2016-05-08 LAB — HEPATITIS C ANTIBODY: HCV Ab: NEGATIVE

## 2016-05-08 LAB — VITAMIN D 25 HYDROXY (VIT D DEFICIENCY, FRACTURES): Vit D, 25-Hydroxy: 59 ng/mL (ref 30–100)

## 2016-06-18 ENCOUNTER — Other Ambulatory Visit: Payer: Self-pay | Admitting: Nurse Practitioner

## 2016-06-18 DIAGNOSIS — Z79890 Hormone replacement therapy (postmenopausal): Secondary | ICD-10-CM

## 2016-06-18 NOTE — Telephone Encounter (Signed)
Medication refill request: Estrace and Prozac  Last AEX:  05/07/16  Next AEX: 05/12/17 PG Last MMG (if hormonal medication request): 06/26/15 BIRADs1:neg  Refill authorized:  estrace 05/07/16 #90tabs/3R to [facility]. Rx marked as refused since refill has been sent.  04/30/16 #90caps/ 0 R. Today please advise.

## 2016-06-24 NOTE — Telephone Encounter (Signed)
Routed to [facility]

## 2016-07-24 ENCOUNTER — Encounter: Payer: Self-pay | Admitting: Internal Medicine

## 2016-07-24 ENCOUNTER — Other Ambulatory Visit: Payer: Self-pay

## 2016-07-24 ENCOUNTER — Other Ambulatory Visit (INDEPENDENT_AMBULATORY_CARE_PROVIDER_SITE_OTHER): Payer: BLUE CROSS/BLUE SHIELD

## 2016-07-24 ENCOUNTER — Ambulatory Visit (INDEPENDENT_AMBULATORY_CARE_PROVIDER_SITE_OTHER): Payer: BLUE CROSS/BLUE SHIELD | Admitting: Internal Medicine

## 2016-07-24 VITALS — BP 122/60 | HR 86 | Ht 63.0 in | Wt 107.0 lb

## 2016-07-24 DIAGNOSIS — Z79899 Other long term (current) drug therapy: Secondary | ICD-10-CM

## 2016-07-24 DIAGNOSIS — K5100 Ulcerative (chronic) pancolitis without complications: Secondary | ICD-10-CM

## 2016-07-24 DIAGNOSIS — K51918 Ulcerative colitis, unspecified with other complication: Secondary | ICD-10-CM

## 2016-07-24 LAB — CBC WITH DIFFERENTIAL/PLATELET
BASOS PCT: 0.1 % (ref 0.0–3.0)
Basophils Absolute: 0 10*3/uL (ref 0.0–0.1)
Eosinophils Absolute: 0 10*3/uL (ref 0.0–0.7)
Eosinophils Relative: 0.7 % (ref 0.0–5.0)
HEMATOCRIT: 36.8 % (ref 36.0–46.0)
Hemoglobin: 12.6 g/dL (ref 12.0–15.0)
LYMPHS ABS: 0.9 10*3/uL (ref 0.7–4.0)
LYMPHS PCT: 13.6 % (ref 12.0–46.0)
MCHC: 34.3 g/dL (ref 30.0–36.0)
MCV: 101.6 fl — AB (ref 78.0–100.0)
MONOS PCT: 7.4 % (ref 3.0–12.0)
Monocytes Absolute: 0.5 10*3/uL (ref 0.1–1.0)
NEUTROS ABS: 4.9 10*3/uL (ref 1.4–7.7)
NEUTROS PCT: 78.2 % — AB (ref 43.0–77.0)
PLATELETS: 267 10*3/uL (ref 150.0–400.0)
RBC: 3.62 Mil/uL — ABNORMAL LOW (ref 3.87–5.11)
RDW: 14.2 % (ref 11.5–15.5)
WBC: 6.3 10*3/uL (ref 4.0–10.5)

## 2016-07-24 LAB — COMPREHENSIVE METABOLIC PANEL
ALT: 20 U/L (ref 0–35)
AST: 19 U/L (ref 0–37)
Albumin: 4.4 g/dL (ref 3.5–5.2)
Alkaline Phosphatase: 35 U/L — ABNORMAL LOW (ref 39–117)
BILIRUBIN TOTAL: 0.7 mg/dL (ref 0.2–1.2)
BUN: 15 mg/dL (ref 6–23)
CALCIUM: 11.4 mg/dL — AB (ref 8.4–10.5)
CHLORIDE: 107 meq/L (ref 96–112)
CO2: 29 meq/L (ref 19–32)
Creatinine, Ser: 0.8 mg/dL (ref 0.40–1.20)
GFR: 77.85 mL/min (ref >60.00)
GLUCOSE: 76 mg/dL (ref 70–99)
POTASSIUM: 4.2 meq/L (ref 3.5–5.1)
Sodium: 140 mEq/L (ref 135–145)
Total Protein: 7 g/dL (ref 6.0–8.3)

## 2016-07-24 NOTE — Progress Notes (Signed)
   Subjective:    Patient ID: Antonietta Jewel, female    DOB: 06-10-1956, 60 y.o.   MRN: [id number]  HPI Ziasia Lenoir is a 60 year old female with long-standing UC dating back approximately 20 years is here for follow-up. She was last seen in the office in August 2016 [place] for surveillance colonoscopy in November 2016. She is maintained on 6-MP at 50 mg daily. Her colonoscopy was endoscopically normal. Surveillance biopsies were also normal. Prior to this she had colonoscopy with Dr. Sharlett Iles in July 2014 which showed pseudopolyps in the right colon and mild proctitis with mild chronic active colitis in the rectum.  She reports she's been feeling great. [place] no GI complaint. No abdominal pain, diarrhea, blood in stool or melena. Good appetite. Stable weight. No upper GI complaint. No hepatobiliary complaint. She does have a history of skin cancer and follows closely with her dermatologist. She saw her dermatologist yesterday.  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  Review of Systems  as per history of present illness, otherwise negative  Current Medications, Allergies, Past Medical History, Past Surgical History, Family History and Social History were reviewed in [employer] record.     Objective:   Physical Exam BP 122/60   Pulse 86   Ht 5' 3"  (1.6 m)   Wt 107 lb (48.5 kg)   LMP 07/31/1998 (Approximate)   BMI 18.95 kg/m  Constitutional: Well-developed and well-nourished. No distress. HEENT: Normocephalic and atraumatic. Oropharynx is clear and moist. No oropharyngeal exudate. Conjunctivae are normal.  No scleral icterus. Neck: Neck supple. Trachea midline. Cardiovascular: Normal rate, regular rhythm and intact distal pulses. No M/R/G Pulmonary/chest: Effort normal and breath sounds normal. No wheezing, rales or rhonchi. Abdominal: Soft, nontender, nondistended. Bowel sounds active throughout. There are no masses  palpable. No hepatosplenomegaly. Extremities: no clubbing, cyanosis, or edema Neurological: Alert and oriented to person place and time. Skin: Skin is warm and dry.  Psychiatric: Normal mood and affect. Behavior is normal.      Assessment & Plan:  60 year old female with long-standing UC dating back approximately 20 years is here for follow-up.  1. Chronic pan ulcerative colitis -- doing very well and clinical remission and histologic remission as of last year this time at surveillance colonoscopy. No flares of her disease this year. We'll continue 6-MP at 50 mg daily. Check CBC and CMP today. Surveillance colonoscopy recommended next November for long-standing colitis. Influenza vaccine recommended today. --Return in one year, at which point we will schedule surveillance colonoscopy  15 minutes spent with the patient today. Greater than 50% was spent in counseling and coordination of care with the patient

## 2016-07-24 NOTE — Patient Instructions (Addendum)
Your physician has requested that you go to the basement for the following lab work before leaving today:CBC, [place].   Continue your mercaptopurine at current dose.   Follow up next October with Dr. Norman Herrlich in the office and Colonoscopy to be scheduled for November.   Please call our office when you are feeling better to schedule your flu shot.

## 2016-08-13 ENCOUNTER — Other Ambulatory Visit: Payer: Self-pay | Admitting: Nurse Practitioner

## 2016-08-13 ENCOUNTER — Other Ambulatory Visit: Payer: Self-pay | Admitting: Internal Medicine

## 2016-08-13 DIAGNOSIS — Z79890 Hormone replacement therapy (postmenopausal): Secondary | ICD-10-CM

## 2016-08-13 NOTE — Telephone Encounter (Signed)
Medication refill request: estrace  Last AEX:  05/07/16 PG Next AEX: 05/12/17 PG Last MMG (if hormonal medication request): 06/26/15 BIRADS1:neg  Refill authorized: 05/07/16 #90tabs/3R. To [facility]

## 2016-11-06 ENCOUNTER — Encounter: Payer: Self-pay | Admitting: Nurse Practitioner

## 2017-02-11 ENCOUNTER — Other Ambulatory Visit (INDEPENDENT_AMBULATORY_CARE_PROVIDER_SITE_OTHER): Payer: BLUE CROSS/BLUE SHIELD

## 2017-02-11 DIAGNOSIS — K51918 Ulcerative colitis, unspecified with other complication: Secondary | ICD-10-CM

## 2017-02-11 LAB — CBC WITH DIFFERENTIAL/PLATELET
BASOS ABS: 0 10*3/uL (ref 0.0–0.1)
Basophils Relative: 0.8 % (ref 0.0–3.0)
Eosinophils Absolute: 0.1 10*3/uL (ref 0.0–0.7)
Eosinophils Relative: 1.4 % (ref 0.0–5.0)
HCT: 36.4 % (ref 36.0–46.0)
Hemoglobin: 12.7 g/dL (ref 12.0–15.0)
LYMPHS PCT: 33.8 % (ref 12.0–46.0)
Lymphs Abs: 1.8 10*3/uL (ref 0.7–4.0)
MCHC: 34.9 g/dL (ref 30.0–36.0)
MCV: 103.3 fl — AB (ref 78.0–100.0)
MONO ABS: 0.5 10*3/uL (ref 0.1–1.0)
Monocytes Relative: 8.7 % (ref 3.0–12.0)
NEUTROS ABS: 2.9 10*3/uL (ref 1.4–7.7)
NEUTROS PCT: 55.3 % (ref 43.0–77.0)
PLATELETS: 300 10*3/uL (ref 150.0–400.0)
RBC: 3.52 Mil/uL — ABNORMAL LOW (ref 3.87–5.11)
RDW: 13.7 % (ref 11.5–15.5)
WBC: 5.2 10*3/uL (ref 4.0–10.5)

## 2017-02-11 LAB — COMPREHENSIVE METABOLIC PANEL
ALT: 17 U/L (ref 0–35)
AST: 19 U/L (ref 0–37)
Albumin: 4.3 g/dL (ref 3.5–5.2)
Alkaline Phosphatase: 34 U/L — ABNORMAL LOW (ref 39–117)
BUN: 17 mg/dL (ref 6–23)
CHLORIDE: 107 meq/L (ref 96–112)
CO2: 25 meq/L (ref 19–32)
Calcium: 10.9 mg/dL — ABNORMAL HIGH (ref 8.4–10.5)
Creatinine, Ser: 0.8 mg/dL (ref 0.40–1.20)
GFR: 77.71 mL/min (ref >60.00)
GLUCOSE: 113 mg/dL — AB (ref 70–99)
POTASSIUM: 3.9 meq/L (ref 3.5–5.1)
Sodium: 136 mEq/L (ref 135–145)
Total Bilirubin: 0.5 mg/dL (ref 0.2–1.2)
Total Protein: 6.5 g/dL (ref 6.0–8.3)

## 2017-02-11 LAB — FOLATE

## 2017-02-11 LAB — VITAMIN B12: Vitamin B-12: 691 pg/mL (ref 211–911)

## 2017-04-08 ENCOUNTER — Telehealth: Payer: Self-pay | Admitting: Obstetrics & Gynecology

## 2017-04-08 NOTE — Telephone Encounter (Signed)
Left message regarding upcoming appointment has been canceled and needs to be rescheduled. °

## 2017-05-12 ENCOUNTER — Ambulatory Visit: Payer: BLUE CROSS/BLUE SHIELD | Admitting: Nurse Practitioner

## 2017-05-22 ENCOUNTER — Ambulatory Visit: Payer: BLUE CROSS/BLUE SHIELD | Admitting: Obstetrics and Gynecology

## 2017-06-12 ENCOUNTER — Encounter: Payer: Self-pay | Admitting: Internal Medicine

## 2017-06-18 ENCOUNTER — Other Ambulatory Visit: Payer: Self-pay

## 2017-06-18 DIAGNOSIS — Z79890 Hormone replacement therapy (postmenopausal): Secondary | ICD-10-CM

## 2017-06-18 MED ORDER — ESTRADIOL 0.5 MG PO TABS: 0.5000 mg | ORAL_TABLET | Freq: Every day | ORAL | 0 refills | Status: DC

## 2017-06-18 NOTE — Telephone Encounter (Signed)
Medication refill request: Estradiol  Last AEX:  05/07/16 PG Next AEX: 08/06/17 JJ Last MMG (if hormonal medication request): 10/07/16 BIRADS 1 negative Refill authorized: 05/07/16 #90 w/3 refills; today please advise

## 2017-07-31 ENCOUNTER — Ambulatory Visit: Payer: BLUE CROSS/BLUE SHIELD | Admitting: Obstetrics and Gynecology

## 2017-08-06 ENCOUNTER — Ambulatory Visit: Payer: BLUE CROSS/BLUE SHIELD | Admitting: Obstetrics and Gynecology

## 2017-08-26 ENCOUNTER — Telehealth: Payer: Self-pay | Admitting: Obstetrics and Gynecology

## 2017-08-26 NOTE — Telephone Encounter (Signed)
Left message for pt to reschedule [facility] appt

## 2017-08-29 ENCOUNTER — Other Ambulatory Visit (INDEPENDENT_AMBULATORY_CARE_PROVIDER_SITE_OTHER): Payer: BLUE CROSS/BLUE SHIELD

## 2017-08-29 ENCOUNTER — Encounter: Payer: Self-pay | Admitting: Internal Medicine

## 2017-08-29 ENCOUNTER — Ambulatory Visit: Payer: BLUE CROSS/BLUE SHIELD | Admitting: Internal Medicine

## 2017-08-29 VITALS — BP 112/66 | HR 64 | Ht 62.75 in | Wt 103.6 lb

## 2017-08-29 DIAGNOSIS — Z23 Encounter for immunization: Secondary | ICD-10-CM

## 2017-08-29 DIAGNOSIS — K5100 Ulcerative (chronic) pancolitis without complications: Secondary | ICD-10-CM | POA: Diagnosis not present

## 2017-08-29 DIAGNOSIS — Z79899 Other long term (current) drug therapy: Secondary | ICD-10-CM

## 2017-08-29 DIAGNOSIS — K5190 Ulcerative colitis, unspecified, without complications: Secondary | ICD-10-CM | POA: Diagnosis not present

## 2017-08-29 LAB — COMPREHENSIVE METABOLIC PANEL
ALT: 16 U/L (ref 0–35)
AST: 17 U/L (ref 0–37)
Albumin: 4.5 g/dL (ref 3.5–5.2)
Alkaline Phosphatase: 38 U/L — ABNORMAL LOW (ref 39–117)
BILIRUBIN TOTAL: 0.7 mg/dL (ref 0.2–1.2)
BUN: 14 mg/dL (ref 6–23)
CALCIUM: 11.5 mg/dL — AB (ref 8.4–10.5)
CHLORIDE: 105 meq/L (ref 96–112)
CO2: 28 meq/L (ref 19–32)
CREATININE: 0.83 mg/dL (ref 0.40–1.20)
GFR: 74.34 mL/min (ref >60.00)
GLUCOSE: 99 mg/dL (ref 70–99)
Potassium: 3.9 mEq/L (ref 3.5–5.1)
SODIUM: 137 meq/L (ref 135–145)
Total Protein: 7 g/dL (ref 6.0–8.3)

## 2017-08-29 LAB — LIPID PANEL
CHOLESTEROL: 237 mg/dL — AB (ref 0–200)
HDL: 68.4 mg/dL (ref >39.00)
LDL Cholesterol: 152 mg/dL — ABNORMAL HIGH (ref 0–99)
NonHDL: 168.74
Total CHOL/HDL Ratio: 3
Triglycerides: 82 mg/dL (ref 0.0–149.0)
VLDL: 16.4 mg/dL (ref 0.0–40.0)

## 2017-08-29 LAB — CBC WITH DIFFERENTIAL/PLATELET
BASOS ABS: 0 10*3/uL (ref 0.0–0.1)
BASOS PCT: 0.8 % (ref 0.0–3.0)
Eosinophils Absolute: 0 10*3/uL (ref 0.0–0.7)
Eosinophils Relative: 0.8 % (ref 0.0–5.0)
HEMATOCRIT: 38 % (ref 36.0–46.0)
Hemoglobin: 12.9 g/dL (ref 12.0–15.0)
LYMPHS ABS: 1.8 10*3/uL (ref 0.7–4.0)
Lymphocytes Relative: 32.7 % (ref 12.0–46.0)
MCHC: 34 g/dL (ref 30.0–36.0)
MCV: 106.7 fl — ABNORMAL HIGH (ref 78.0–100.0)
MONO ABS: 0.5 10*3/uL (ref 0.1–1.0)
Monocytes Relative: 8.4 % (ref 3.0–12.0)
NEUTROS PCT: 57.3 % (ref 43.0–77.0)
Neutro Abs: 3.2 10*3/uL (ref 1.4–7.7)
PLATELETS: 261 10*3/uL (ref 150.0–400.0)
RBC: 3.56 Mil/uL — ABNORMAL LOW (ref 3.87–5.11)
RDW: 14.2 % (ref 11.5–15.5)
WBC: 5.5 10*3/uL (ref 4.0–10.5)

## 2017-08-29 LAB — VITAMIN B12: VITAMIN B 12: 504 pg/mL (ref 211–911)

## 2017-08-29 MED ORDER — MERCAPTOPURINE 50 MG PO TABS: ORAL_TABLET | ORAL | 1 refills | Status: DC

## 2017-08-29 MED ORDER — SUPREP BOWEL PREP KIT 17.5-3.13-1.6 GM/177ML PO SOLN: 1.0000 | ORAL | 0 refills | Status: DC

## 2017-08-29 NOTE — Progress Notes (Signed)
Subjective:    Patient ID: Ellen Parks, female    DOB: March 02, 1956, 61 y.o.   MRN: [id number]  HPI  Raychelle Hudman is a 61 year old female with long-standing ulcerative colitis dating back 20 years who is here for follow-up.  She was last seen in the office on 07/24/2016.  She has been maintained on 6-MP 50 mg daily.  She had colonoscopy last in November 2016.  This was endoscopically normal as well as histologically normal.  She has been feeling well.  She has no GI complaint.  Occasionally she will have very fleeting left lower quadrant abdominal pain but this is rare and lasts a very short amount of time.  No blood in her stool or melena.  No rectal bleeding.  Good appetite.  No nausea or vomiting.  No dysphagia or odynophagia.  No rashes, no oral ulcers or new joint pains.  She follows with dermatology.  Not had flu vaccine this year or any prior pneumonia vaccine   Review of Systems As per HPI, otherwise negative  Current Medications, Allergies, Past Medical History, Past Surgical History, Family History and Social History were reviewed in [employer] record.     Objective:   Physical Exam BP 112/66   Pulse 64   Ht 5' 2.75" (1.594 m)   Wt 103 lb 9.6 oz (47 kg)   LMP 07/31/1998 (Approximate)   BMI 18.50 kg/m  Constitutional: Well-developed and well-nourished. No distress. HEENT: Normocephalic and atraumatic. Conjunctivae are normal.  No scleral icterus. Neck: Neck supple. Trachea midline. Cardiovascular: Normal rate, regular rhythm and intact distal pulses. No M/R/G Pulmonary/chest: Effort normal and breath sounds normal. No wheezing, rales or rhonchi. Abdominal: Soft, nontender, nondistended. Bowel sounds active throughout. There are no masses palpable. No hepatosplenomegaly. Extremities: no clubbing, cyanosis, or edema Neurological: Alert and oriented to person place and time. Skin: Skin is warm and dry. Psychiatric: Normal mood and affect.  Behavior is normal.  CBC    Component Value Date/Time   WBC 5.5 08/29/2017 1453   RBC 3.56 (L) 08/29/2017 1453   HGB 12.9 08/29/2017 1453   HCT 38.0 08/29/2017 1453   PLT 261.0 08/29/2017 1453   MCV 106.7 (H) 08/29/2017 1453   MCHC 34.0 08/29/2017 1453   RDW 14.2 08/29/2017 1453   LYMPHSABS 1.8 08/29/2017 1453   MONOABS 0.5 08/29/2017 1453   EOSABS 0.0 08/29/2017 1453   BASOSABS 0.0 08/29/2017 1453   CMP     Component Value Date/Time   NA 137 08/29/2017 1453   K 3.9 08/29/2017 1453   CL 105 08/29/2017 1453   CO2 28 08/29/2017 1453   GLUCOSE 99 08/29/2017 1453   BUN 14 08/29/2017 1453   CREATININE 0.83 08/29/2017 1453   CALCIUM 11.5 (H) 08/29/2017 1453   CALCIUM 11.0 (H) 04/10/2010 2225   PROT 7.0 08/29/2017 1453   ALBUMIN 4.5 08/29/2017 1453   AST 17 08/29/2017 1453   ALT 16 08/29/2017 1453   ALKPHOS 38 (L) 08/29/2017 1453   BILITOT 0.7 08/29/2017 1453   GFRNONAA 99.42 04/10/2010 1124   GFRAA 113 06/02/2008 0947      Assessment & Plan:  61 year old female with long-standing ulcerative colitis dating back 20 years who is here for follow-up.   1.  Chronic pan ulcerative colitis --doing very well clinically and at the time of last colonoscopy 2 years ago.  No flares since that time.  She is been maintained on 6-MP 50 mg daily and we will continue this dose.  She is due surveillance colonoscopy at this time.  We discussed the risks, benefits and alternatives and she is agreeable and wishes to proceed.  We reviewed the risks, benefits and alternatives to chronic 6-MP therapy and she wishes to continue.  I think this is wise given her history. --We discussed influenza and pneumococcal vaccination today and she is agreeable.  She will receive influenza vaccine and Pneumovax.  She would be a candidate for pneumococcal 23 in about a year. --Labs today to include liver and kidney function and blood counts.  Also check B12 and cholesterol at her request.  25 minutes spent with the  patient today. Greater than 50% was spent in counseling and coordination of care with the patient

## 2017-08-29 NOTE — Patient Instructions (Signed)
Your physician has requested that you go to the basement for the following lab work before leaving today: CBC, CMP, Lipid, Vitamin B12,   We have given you a pneumonia (pneumovax) vaccine today. You may experience a small amount of swelling and redness at the injection site. This is normal. Should you experience these symptoms, please apply ice to the injection area for 10-15 minutes every 2-3 hours. However, should these symptoms or any other symptoms related to the injection concern you, please call our office at [phone].  We have given you the flu shot today.  You have been scheduled for a colonoscopy. Please follow written instructions given to you at your visit today.  Please pick up your prep supplies at the pharmacy within the next 1-3 days. If you use inhalers (even only as needed), please bring them with you on the day of your procedure. Your physician has requested that you go to www.startemmi.com and enter the access code given to you at your visit today. This web site gives a general overview about your procedure. However, you should still follow specific instructions given to you by our office regarding your preparation for the procedure.  Continue mercaptopurine.  If you are age 5 or older, your body mass index should be between 23-30. Your Body mass index is 18.5 kg/m. If this is out of the aforementioned range listed, please consider follow up with your Primary Care Provider.  If you are age 61 or younger, your body mass index should be between 19-25. Your Body mass index is 18.5 kg/m. If this is out of the aformentioned range listed, please consider follow up with your Primary Care Provider.

## 2017-09-01 ENCOUNTER — Other Ambulatory Visit: Payer: Self-pay

## 2017-09-25 ENCOUNTER — Other Ambulatory Visit: Payer: Self-pay | Admitting: Obstetrics and Gynecology

## 2017-09-25 ENCOUNTER — Ambulatory Visit: Payer: BLUE CROSS/BLUE SHIELD | Admitting: Obstetrics and Gynecology

## 2017-09-25 DIAGNOSIS — Z79890 Hormone replacement therapy (postmenopausal): Secondary | ICD-10-CM

## 2017-09-26 ENCOUNTER — Telehealth: Payer: Self-pay | Admitting: Internal Medicine

## 2017-09-26 ENCOUNTER — Other Ambulatory Visit: Payer: Self-pay

## 2017-09-26 NOTE — Telephone Encounter (Signed)
Patient returned call. AEX scheduled for Monday 10/13/16 at 1345 with Dr. Talbert Nan.

## 2017-09-26 NOTE — Telephone Encounter (Signed)
Medication refill request: Estrace Last AEX:  05/07/16 PG  Next AEX: detailed message left for patient to return call to schedule aex  Last MMG (if hormonal medication request): 10/07/16 BIRADS 1 negative  Refill authorized: 06/18/17 #90, 0RF. Today, please advise.

## 2017-09-26 NOTE — Telephone Encounter (Signed)
Informed patient that I am not sure if Dr. Hilarie Fredrickson will use an alternative prep but I will speak to him or his nurse on Monday and call her back. Patietn asked if all preps were not covered because this one was over 130 dollars. Informed patient most of the newer preps are not covered by insurances but some of the preps that are higher volume are cheaper. Patient verbalized understanding. Patient states "I will wait to get another Colonoscopy if means I have to pay 130 dollars for a prep kit." I informed patient that was her choice but I will call her Monday.

## 2017-09-26 NOTE — Telephone Encounter (Signed)
Medication refill request: Prozac Last AEX:  05/07/16 PG Next AEX: 10/13/17 JJ Last MMG (if hormonal medication request): 10/07/16 BIRADS 1 negative  Refill authorized: 06/24/16 #90 w/3 refills; today please advise

## 2017-09-28 MED ORDER — FLUOXETINE HCL 10 MG PO CAPS: 10.0000 mg | ORAL_CAPSULE | Freq: Every day | ORAL | 0 refills | Status: DC

## 2017-09-29 MED ORDER — PEG 3350-KCL-NABCB-NACL-NASULF 236 G PO SOLR: 4000.0000 mL | Freq: Once | ORAL | 0 refills | Status: AC

## 2017-09-29 NOTE — Telephone Encounter (Signed)
I have spoken to patient to advise that we can offer her Golytely as an option to Suprep but it is a higher volume prep. Patient would like to go with this prep. I have briefly instructed her on this prep and have sent this to her in the mail as well. New prep sent to pharmacy.

## 2017-10-09 ENCOUNTER — Ambulatory Visit (AMBULATORY_SURGERY_CENTER): Payer: BLUE CROSS/BLUE SHIELD | Admitting: Internal Medicine

## 2017-10-09 ENCOUNTER — Encounter: Payer: Self-pay | Admitting: Internal Medicine

## 2017-10-09 VITALS — BP 128/72 | HR 71 | Temp 98.9°F | Resp 16 | Ht 62.0 in | Wt 103.0 lb

## 2017-10-09 DIAGNOSIS — K635 Polyp of colon: Secondary | ICD-10-CM | POA: Diagnosis not present

## 2017-10-09 DIAGNOSIS — D122 Benign neoplasm of ascending colon: Secondary | ICD-10-CM

## 2017-10-09 DIAGNOSIS — K51019 Ulcerative (chronic) pancolitis with unspecified complications: Secondary | ICD-10-CM

## 2017-10-09 DIAGNOSIS — D125 Benign neoplasm of sigmoid colon: Secondary | ICD-10-CM

## 2017-10-09 DIAGNOSIS — K529 Noninfective gastroenteritis and colitis, unspecified: Secondary | ICD-10-CM | POA: Diagnosis not present

## 2017-10-09 MED ORDER — SODIUM CHLORIDE 0.9 % IV SOLN: 500.0000 mL | Freq: Once | INTRAVENOUS | Status: DC

## 2017-10-09 NOTE — Progress Notes (Signed)
Called to room to assist during endoscopic procedure.  Patient ID and intended procedure confirmed with present staff. Received instructions for my participation in the procedure from the performing physician.  

## 2017-10-09 NOTE — Patient Instructions (Signed)
YOU HAD AN ENDOSCOPIC PROCEDURE TODAY AT [facility]:   Refer to the procedure report that was given to you for any specific questions about what was found during the examination.  If the procedure report does not answer your questions, please call your gastroenterologist to clarify.  If you requested that your care partner not be given the details of your procedure findings, then the procedure report has been included in a sealed envelope for you to review at your convenience later.  YOU SHOULD EXPECT: Some feelings of bloating in the abdomen. Passage of more gas than usual.  Walking can help get rid of the air that was put into your GI tract during the procedure and reduce the bloating. If you had a lower endoscopy (such as a colonoscopy or flexible sigmoidoscopy) you may notice spotting of blood in your stool or on the toilet paper. If you underwent a bowel prep for your procedure, you may not have a normal bowel movement for a few days.  Please Note:  You might notice some irritation and congestion in your nose or some drainage.  This is from the oxygen used during your procedure.  There is no need for concern and it should clear up in a day or so.  SYMPTOMS TO REPORT IMMEDIATELY:   Following lower endoscopy (colonoscopy or flexible sigmoidoscopy):  Excessive amounts of blood in the stool  Significant tenderness or worsening of abdominal pains  Swelling of the abdomen that is new, acute  Fever of 100F or higher   For urgent or emergent issues, a gastroenterologist can be reached at any hour by calling [phone].   DIET:  We do recommend a small meal at first, but then you may proceed to your regular diet.  Drink plenty of fluids but you should avoid alcoholic beverages for 24 hours.  ACTIVITY:  You should plan to take it easy for the rest of today and you should NOT DRIVE or use heavy machinery until tomorrow (because of the sedation medicines used during the test).     FOLLOW UP: Our staff will call the number listed on your records the next business day following your procedure to check on you and address any questions or concerns that you may have regarding the information given to you following your procedure. If we do not reach you, we will leave a message.  However, if you are feeling well and you are not experiencing any problems, there is no need to return our call.  We will assume that you have returned to your regular daily activities without incident.  If any biopsies were taken you will be contacted by phone or by letter within the next 1-3 weeks.  Please call us at [phone] if you have not heard about the biopsies in 3 weeks.    SIGNATURES/CONFIDENTIALITY: You and/or your care partner have signed paperwork which will be entered into your electronic medical record.  These signatures attest to the fact that that the information above on your After Visit Summary has been reviewed and is understood.  Full responsibility of the confidentiality of this discharge information lies with you and/or your care-partner.  Read all of the handouts given to you by your recovery room nurse. Thank-you for choosing [place] for your healthcare needs today.

## 2017-10-09 NOTE — Progress Notes (Signed)
Report to PACU, RN, vss, BBS= Clear.  

## 2017-10-09 NOTE — Progress Notes (Signed)
Pt's states no medical or surgical changes since previsit or office visit. 

## 2017-10-09 NOTE — Op Note (Signed)
[facility] Patient Name: Ellen Parks Procedure Date: 10/09/2017 1:37 PM MRN: [id number] Endoscopist: Jerene Bears , MD Age: 62 Referring MD:  Date of Birth: Dec 04, 1955 Gender: Female Account #: [id number] Procedure:                Colonoscopy Indications:              High risk colon cancer surveillance: Ulcerative                            pancolitis of 8 (or more) years duration, Last                            colonoscopy: November 2016 Medicines:                Monitored Anesthesia Care Procedure:                Pre-Anesthesia Assessment:                           - Prior to the procedure, a History and Physical                            was performed, and patient medications and                            allergies were reviewed. The patient's tolerance of                            previous anesthesia was also reviewed. The risks                            and benefits of the procedure and the sedation                            options and risks were discussed with the patient.                            All questions were answered, and informed consent                            was obtained. Prior Anticoagulants: The patient has                            taken no previous anticoagulant or antiplatelet                            agents. ASA Grade Assessment: II - A patient with                            mild systemic disease. After reviewing the risks                            and benefits, the patient was deemed in  satisfactory condition to undergo the procedure.                           After obtaining informed consent, the colonoscope                            was passed under direct vision. Throughout the                            procedure, the patient's blood pressure, pulse, and                            oxygen saturations were monitored continuously. The                            Model PCF-H190DL [id number]) scope  was introduced                            through the anus and advanced to the the cecum,                            identified by appendiceal orifice and ileocecal                            valve. The colonoscopy was performed without                            difficulty. The patient tolerated the procedure                            well. The quality of the bowel preparation was                            good. The ileocecal valve, appendiceal orifice, and                            rectum were photographed. Scope In: 1:42:40 PM Scope Out: 2:09:42 PM Scope Withdrawal Time: 0 hours 11 minutes 33 seconds  Total Procedure Duration: 0 hours 27 minutes 2 seconds  Findings:                 The digital rectal exam was normal.                           A 2 mm polyp was found in the ascending colon. The                            polyp was sessile. The polyp was removed with a                            cold biopsy forceps. Resection and retrieval were                            complete.  A 6 mm polyp was found in the sigmoid colon. The                            polyp was sessile. The polyp was removed with a                            cold snare. Resection and retrieval were complete.                           Normal mucosa was found in the entire colon. Four                            biopsies were taken every 10 cm with a cold forceps                            from the entire colon for dysplasia surveillance                            and ulcerative colitis surveillance. These biopsy                            specimens from the right colon and left colon were                            sent to Pathology.                           The entire examined colon appeared normal.                           Retroflexion in the rectum was not performed due to                            anatomy. Complications:            No immediate complications. Estimated Blood Loss:      Estimated blood loss was minimal. Impression:               - One 2 mm polyp in the ascending colon, removed                            with a cold biopsy forceps. Resected and retrieved.                           - One 6 mm polyp in the sigmoid colon, removed with                            a cold snare. Resected and retrieved.                           - Normal mucosa in the entire examined colon.  Biopsied.                           - No evidence of active ulcerative colitis. Recommendation:           - Patient has a contact number available for                            emergencies. The signs and symptoms of potential                            delayed complications were discussed with the                            patient. Return to normal activities tomorrow.                            Written discharge instructions were provided to the                            patient.                           - Resume previous diet.                           - Continue present medications.                           - Await pathology results.                           - Repeat colonoscopy is recommended for                            surveillance. The colonoscopy date will be                            determined after pathology results from today's                            exam become available for review. Jerene Bears, MD 10/09/2017 2:17:38 PM This report has been signed electronically.

## 2017-10-10 ENCOUNTER — Telehealth: Payer: Self-pay

## 2017-10-10 NOTE — Telephone Encounter (Signed)
  Follow up Call-  Call back number 10/09/2017 08/15/2015  Post procedure Call Back phone  # [phone]  Permission to leave phone message Yes Yes  Some recent data might be hidden     Patient questions:  Do you have a fever, pain , or abdominal swelling? No. Pain Score  0 *  Have you tolerated food without any problems? Yes.    Have you been able to return to your normal activities? Yes.    Do you have any questions about your discharge instructions: Diet   No. Medications  No. Follow up visit  No.  Do you have questions or concerns about your Care? No.  Actions: * If pain score is 4 or above: No action needed, pain <4.

## 2017-10-13 ENCOUNTER — Ambulatory Visit: Payer: BLUE CROSS/BLUE SHIELD | Admitting: Obstetrics and Gynecology

## 2017-10-13 ENCOUNTER — Encounter: Payer: Self-pay | Admitting: Obstetrics and Gynecology

## 2017-10-13 ENCOUNTER — Other Ambulatory Visit: Payer: Self-pay

## 2017-10-13 VITALS — BP 122/82 | HR 64 | Resp 14 | Ht 62.0 in | Wt 99.0 lb

## 2017-10-13 DIAGNOSIS — Z01419 Encounter for gynecological examination (general) (routine) without abnormal findings: Secondary | ICD-10-CM

## 2017-10-13 DIAGNOSIS — Z79890 Hormone replacement therapy (postmenopausal): Secondary | ICD-10-CM | POA: Diagnosis not present

## 2017-10-13 DIAGNOSIS — E559 Vitamin D deficiency, unspecified: Secondary | ICD-10-CM | POA: Diagnosis not present

## 2017-10-13 DIAGNOSIS — M8580 Other specified disorders of bone density and structure, unspecified site: Secondary | ICD-10-CM

## 2017-10-13 MED ORDER — ESTRADIOL 0.5 MG PO TABS: 0.5000 mg | ORAL_TABLET | Freq: Every day | ORAL | 2 refills | Status: DC

## 2017-10-13 MED ORDER — FLUOXETINE HCL 10 MG PO CAPS
10.0000 mg | ORAL_CAPSULE | Freq: Every day | ORAL | 3 refills | Status: AC
Start: 2017-10-13 — End: ?

## 2017-10-13 NOTE — Patient Instructions (Addendum)
You can estroven or estroven pm for hot flashes/night sweats.   EXERCISE AND DIET:  We recommended that you start or continue a regular exercise program for good health. Regular exercise means any activity that makes your heart beat faster and makes you sweat.  We recommend exercising at least 30 minutes per day at least 3 days a week, preferably 4 or 5.  We also recommend a diet low in fat and sugar.  Inactivity, poor dietary choices and obesity can cause diabetes, heart attack, stroke, and kidney damage, among others.    ALCOHOL AND SMOKING:  Women should limit their alcohol intake to no more than 7 drinks/beers/glasses of wine (combined, not each!) per week. Moderation of alcohol intake to this level decreases your risk of breast cancer and liver damage. And of course, no recreational drugs are part of a healthy lifestyle.  And absolutely no smoking or even second hand smoke. Most people know smoking can cause heart and lung diseases, but did you know it also contributes to weakening of your bones? Aging of your skin?  Yellowing of your teeth and nails?  CALCIUM AND VITAMIN D:  Adequate intake of calcium and Vitamin D are recommended.  The recommendations for exact amounts of these supplements seem to change often, but generally speaking 600 mg of calcium (either carbonate or citrate) and 800 units of Vitamin D per day seems prudent. Certain women may benefit from higher intake of Vitamin D.  If you are among these women, your doctor will have told you during your visit.    PAP SMEARS:  Pap smears, to check for cervical cancer or precancers,  have traditionally been done yearly, although recent scientific advances have shown that most women can have pap smears less often.  However, every woman still should have a physical exam from her gynecologist every year. It will include a breast check, inspection of the vulva and vagina to check for abnormal growths or skin changes, a visual exam of the cervix, and  then an exam to evaluate the size and shape of the uterus and ovaries.  And after 61 years of age, a rectal exam is indicated to check for rectal cancers. We will also provide age appropriate advice regarding health maintenance, like when you should have certain vaccines, screening for sexually transmitted diseases, bone density testing, colonoscopy, mammograms, etc.   MAMMOGRAMS:  All women over 71 years old should have a yearly mammogram. Many facilities now offer a "3D" mammogram, which may cost around $50 extra out of pocket. If possible,  we recommend you accept the option to have the 3D mammogram performed.  It both reduces the number of women who will be called back for extra views which then turn out to be normal, and it is better than the routine mammogram at detecting truly abnormal areas.    COLONOSCOPY:  Colonoscopy to screen for colon cancer is recommended for all women at age 67.  We know, you hate the idea of the prep.  We agree, BUT, having colon cancer and not knowing it is worse!!  Colon cancer so often starts as a polyp that can be seen and removed at colonscopy, which can quite literally save your life!  And if your first colonoscopy is normal and you have no family history of colon cancer, most women don't have to have it again for 10 years.  Once every ten years, you can do something that may end up saving your life, right?  We will  be happy to help you get it scheduled when you are ready.  Be sure to check your insurance coverage so you understand how much it will cost.  It may be covered as a preventative service at no cost, but you should check your particular policy.

## 2017-10-13 NOTE — Progress Notes (Signed)
62 y.o. [id number] MarriedCaucasianF here for annual exam.  H/O LAVH/BSO. She is sexually active, discomfort helped with lubricant. She is on ERT, has tried going off in the past, would get hot flashes.  She has a h/o ulcerative colitis, has been doing very well (on juice plus supplements)    Patient's last menstrual period was 07/31/1998 (approximate).          Sexually active: Yes.    The current method of family planning is status post hysterectomy.    Exercising: Yes.    walking/ [place]/ TRT Smoker:  Former smoker  Health Maintenance: Pap:  Years ago History of abnormal Pap:  Yes- years ago, never needed cervical surgery, never told she had dysplasia.   MMG:  10-07-16 WNL  Colonoscopy:  10-09-17 polyps, f/u in 3 years.  BMD:   07-27-15 Osteopenic TDaP:  Unsure  Gardasil: N/A   reports that she quit smoking about 24 years ago. Her smoking use included cigarettes. She has a 12.00 pack-year smoking history. she has never used smokeless tobacco. She reports that she drinks alcohol. She reports that she does not use drugs. 2 grown daughters (live in [place]), no grand children, neither is married.  The patient works for [employer]  Past Medical History:  Diagnosis Date  . Anemia    during pregancy  . Depression   . Hemorrhoids 10/97   Symptomatic   . Hyperparathyroidism ([place])   . IBS (irritable bowel syndrome)   . Migraine without aura   . Osteopenia    secondary to hyperparathyroid 2000 -2001  . 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 Symptomatic menopausal or female climacteric states   . Ulcerative colitis, unspecified   . Vitamin B12 deficiency     Past Surgical History:  Procedure Laterality Date  . COLONOSCOPY    . EXPLORATORY LAPAROTOMY Left 10/1993   ruptured cyst-abnormal cells  . LAPAROSCOPIC VAGINAL HYSTERECTOMY  08/11/95   LAVH RSO- AUB, pain, fibroids RO cyst  . 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 PELVIC LAPAROSCOPY Left 11/1993   LSO, benign serous cystadenoma  . SQUAMOUS CELL CARCINOMA EXCISION Bilateral 2014, 2012, 2015, 2016, 2017   legs, nose, hands    Current Outpatient Medications  Medication Sig Dispense Refill  . B Complex Vitamins (VITAMIN B COMPLEX PO) Take 1 capsule by mouth 2 (two) times daily.    . Cholecalciferol (VITAMIN D3) 5000 UNITS CAPS Take 1 capsule by mouth daily.    Marland Kitchen estradiol (ESTRACE) 0.5 MG tablet TAKE ONE TABLET BY MOUTH ONE TIME DAILY  90 tablet 0  . FLUoxetine (PROZAC) 10 MG capsule Take 1 capsule (10 mg total) by mouth daily. 90 capsule 0  . mercaptopurine (PURINETHOL) 50 MG tablet TAKE 1 TABLET BY MOUTH DAILY ON EMPTY STOMACH 1 HOUR BEFORE OR 2 HOURSAFTER MEALS. 90 tablet 1  . Nutritional Supplements (JUICE PLUS FIBRE PO) Take 4 tablets by mouth daily.     Marland Kitchen topiramate (TOPAMAX) 100 MG tablet Take 50 mg by mouth daily.     Marland Kitchen zolmitriptan (ZOMIG) 5 MG tablet Take 5 mg by mouth as needed.       No current facility-administered medications for this visit.     Family History  Problem Relation Age of Onset  . Heart disease Father   . Hypertension Father   . Hyperlipidemia Father   .  Stomach cancer Mother   . Hypertension Mother   . Hearing loss Mother   . Stroke Mother   . Colon cancer Neg Hx   . Esophageal cancer Neg Hx   . Rectal cancer Neg Hx     Review of Systems  Constitutional: Negative.   HENT: Negative.   Eyes: Negative.   Respiratory: Negative.   Cardiovascular: Negative.   Gastrointestinal: Negative.   Endocrine: Negative.   Genitourinary: Negative.   Musculoskeletal: Negative.   Skin: Negative.   Allergic/Immunologic: Negative.   Neurological: Negative.   Psychiatric/Behavioral: Negative.     Exam:   BP 122/82 (BP Location: Right Arm, Patient Position: Sitting, Cuff Size: Normal)   Pulse 64   Resp 14   Ht 5' 2"  (1.575 m)   Wt  99 lb (44.9 kg)   LMP 07/31/1998 (Approximate)   BMI 18.11 kg/m   Weight change: @WEIGHTCHANGE @ Height:   Height: 5' 2"  (157.5 cm)  Ht Readings from Last 3 Encounters:  10/13/17 5' 2"  (1.575 m)  10/09/17 5' 2"  (1.575 m)  08/29/17 5' 2.75" (1.594 m)    General appearance: alert, cooperative and appears stated age Head: Normocephalic, without obvious abnormality, atraumatic Neck: no adenopathy, supple, symmetrical, trachea midline and thyroid normal to inspection and palpation Lungs: clear to auscultation bilaterally Cardiovascular: regular rate and rhythm Breasts: normal appearance, no masses or tenderness Abdomen: soft, non-tender; non distended,  no masses,  no organomegaly Extremities: extremities normal, atraumatic, no cyanosis or edema Skin: Skin color, texture, turgor normal. No rashes or lesions Lymph nodes: Cervical, supraclavicular, and axillary nodes normal. No abnormal inguinal nodes palpated Neurologic: Grossly normal   Pelvic: External genitalia:  no lesions              Urethra:  normal appearing urethra with no masses, tenderness or lesions              Bartholins and Skenes: normal                 Vagina: normal appearing vagina with mild atrophy, no discharge, no lesions              Cervix: absent               Bimanual Exam:  Uterus:  uterus absent              Adnexa: no mass, fullness, tenderness               Rectovaginal: Confirms               Anus:  normal sphincter tone, no lesions  Chaperone was present for exam.  A:  Well Woman with normal exam  H/O osteopenia on vit d daily (c/w prior def)  On ERT, willing to try to cut down, discussed risks.   Depression  P:   No pap needed  Labs UTD with her GI MD  Mammogram due  Vit d  DEXA  Discussed breast self exam  Discussed calcium and vit D intake  She will call if she wants to start vaginal estrogen

## 2017-10-14 LAB — VITAMIN D 25 HYDROXY (VIT D DEFICIENCY, FRACTURES): VIT D 25 HYDROXY: 39.2 ng/mL (ref 30.0–100.0)

## 2017-10-15 ENCOUNTER — Encounter: Payer: Self-pay | Admitting: Internal Medicine

## 2017-10-30 ENCOUNTER — Other Ambulatory Visit: Payer: Self-pay | Admitting: Obstetrics and Gynecology

## 2017-10-30 DIAGNOSIS — Z1239 Encounter for other screening for malignant neoplasm of breast: Secondary | ICD-10-CM

## 2017-10-30 DIAGNOSIS — M8580 Other specified disorders of bone density and structure, unspecified site: Secondary | ICD-10-CM

## 2017-12-03 ENCOUNTER — Ambulatory Visit (INDEPENDENT_AMBULATORY_CARE_PROVIDER_SITE_OTHER): Payer: BLUE CROSS/BLUE SHIELD

## 2017-12-03 DIAGNOSIS — Z1231 Encounter for screening mammogram for malignant neoplasm of breast: Secondary | ICD-10-CM | POA: Diagnosis not present

## 2017-12-03 DIAGNOSIS — M8580 Other specified disorders of bone density and structure, unspecified site: Secondary | ICD-10-CM

## 2017-12-03 DIAGNOSIS — Z1239 Encounter for other screening for malignant neoplasm of breast: Secondary | ICD-10-CM

## 2017-12-03 DIAGNOSIS — M810 Age-related osteoporosis without current pathological fracture: Secondary | ICD-10-CM

## 2017-12-03 DIAGNOSIS — M85852 Other specified disorders of bone density and structure, left thigh: Secondary | ICD-10-CM

## 2017-12-11 ENCOUNTER — Telehealth: Payer: Self-pay | Admitting: *Deleted

## 2017-12-11 NOTE — Telephone Encounter (Signed)
Left message to call regarding DEXA results-eh

## 2017-12-11 NOTE — Telephone Encounter (Signed)
-----   Message from Salvadore Dom, MD sent at 12/10/2017  5:51 PM EDT ----- Please inform the patient that she has osteoporosis and set her up for an office visit to discuss the results and make a plan

## 2017-12-15 NOTE — Telephone Encounter (Signed)
Mailed up to date info to patient -eh

## 2017-12-15 NOTE — Telephone Encounter (Signed)
Please send the patient information on osteoporosis, then close the encounter.

## 2017-12-15 NOTE — Telephone Encounter (Signed)
Spoke with patient and gave results. I tried to scheduled for patient to come in to discuss treatment plan. Patient declined appointment stating that she does not want to do any treatments at this time. -eh

## 2018-02-18 ENCOUNTER — Telehealth: Payer: Self-pay | Admitting: Obstetrics and Gynecology

## 2018-02-18 NOTE — Telephone Encounter (Signed)
Left message to call Anie Juniel at [phone]. 

## 2018-02-18 NOTE — Telephone Encounter (Signed)
Patient stated that Prozac is not working for her and would like to try a different medication. Patient stated that she has been reading up on Zoloft.

## 2018-02-26 NOTE — Telephone Encounter (Signed)
Left message to call Alisah Grandberry at [phone]. 

## 2018-03-04 NOTE — Telephone Encounter (Signed)
Dr.Jertson, attempted to reach patient x 2 with no return call. Okay to close encounter?

## 2018-03-04 NOTE — Telephone Encounter (Signed)
Will close encounter

## 2018-06-15 ENCOUNTER — Other Ambulatory Visit: Payer: Self-pay | Admitting: Internal Medicine

## 2018-08-24 ENCOUNTER — Other Ambulatory Visit: Payer: Self-pay | Admitting: Internal Medicine

## 2018-08-26 ENCOUNTER — Other Ambulatory Visit: Payer: Self-pay | Admitting: Obstetrics and Gynecology

## 2018-08-26 DIAGNOSIS — Z79890 Hormone replacement therapy (postmenopausal): Secondary | ICD-10-CM

## 2018-08-26 NOTE — Telephone Encounter (Signed)
Medication refill request: estrace 0.5 mg tab  Last AEX:  10/13/17 JJ Next AEX: 10/26/18 JJ Last MMG (if hormonal medication request): 12/03/17 [id number]:Neg  Refill authorized: 10/13/17 #[id number]. Today please advise.

## 2018-09-10 ENCOUNTER — Encounter

## 2018-10-26 ENCOUNTER — Ambulatory Visit: Payer: BLUE CROSS/BLUE SHIELD | Admitting: Obstetrics and Gynecology

## 2018-11-14 ENCOUNTER — Other Ambulatory Visit: Payer: Self-pay | Admitting: Internal Medicine

## 2018-11-23 ENCOUNTER — Other Ambulatory Visit: Payer: Self-pay | Admitting: Internal Medicine

## 2019-07-25 IMAGING — MG DIGITAL SCREENING BILATERAL MAMMOGRAM WITH TOMO AND CAD
8 series · 9 of 28 positions shown · non-contrast
Comparison: Previous exam(s).

CLINICAL DATA: Screening.

EXAM:
DIGITAL SCREENING BILATERAL MAMMOGRAM WITH TOMO AND CAD

[R CC synth-2D]
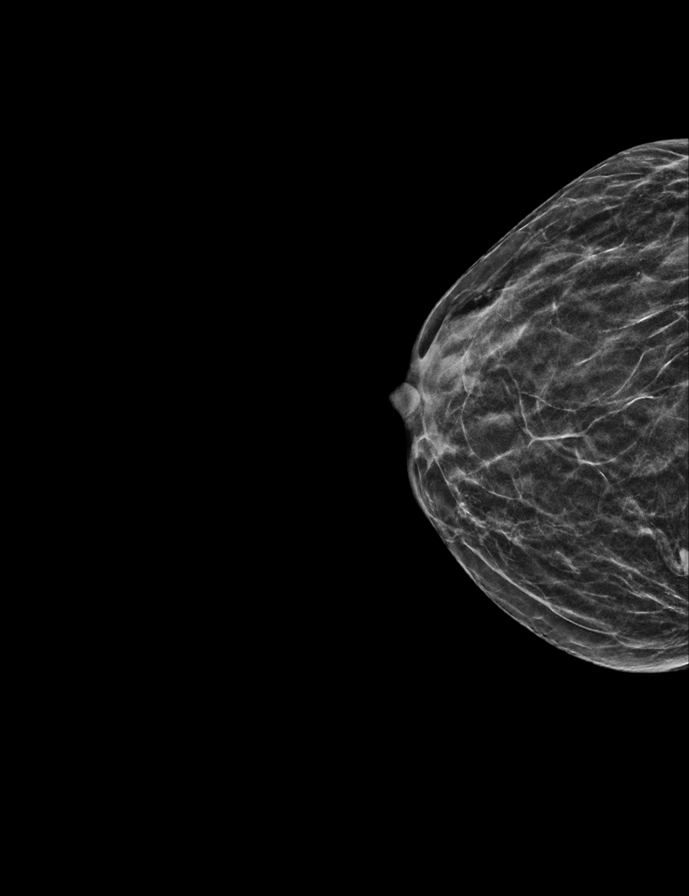

[L XCCL synth-2D]
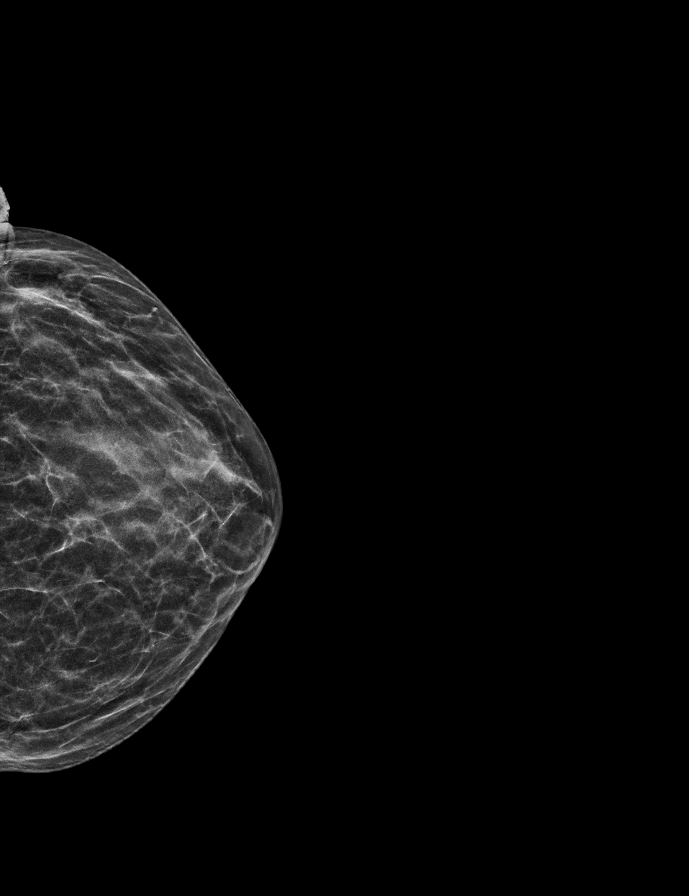

[L MLO synth-2D]
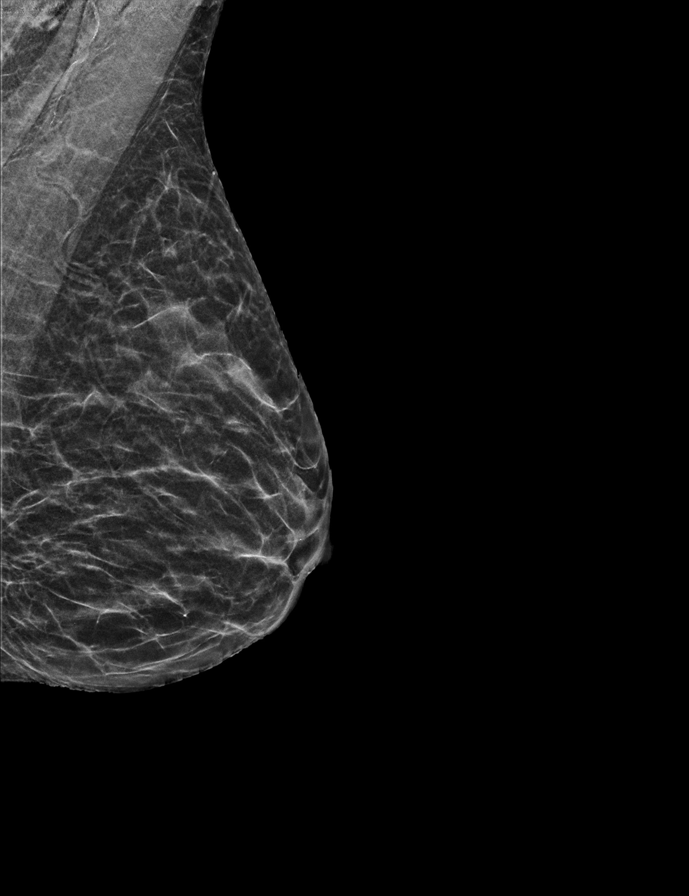

[L MLO tomo · 2 of 34 frames shown]
[frame 12/34]
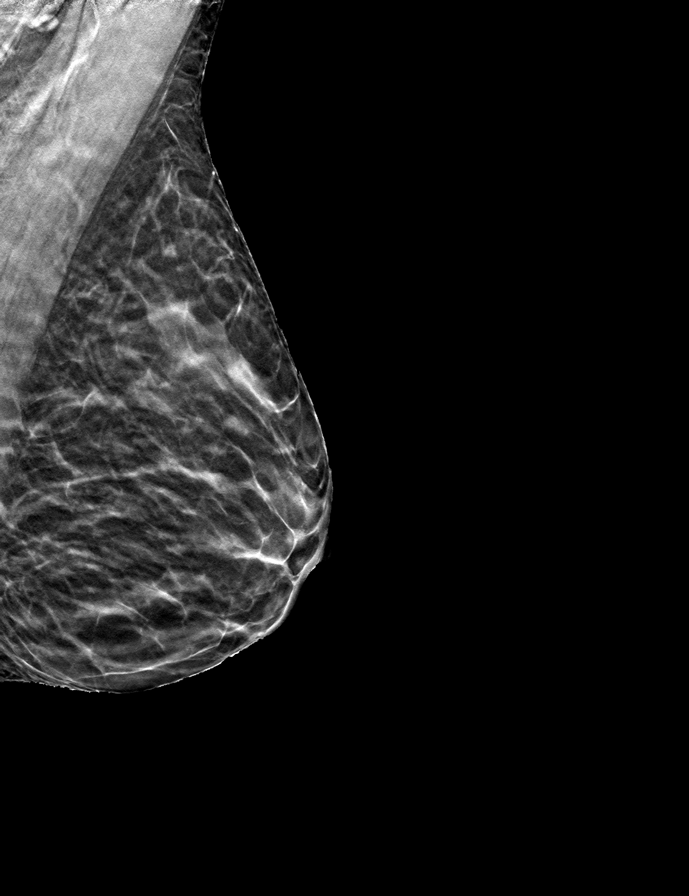
[frame 17/34]
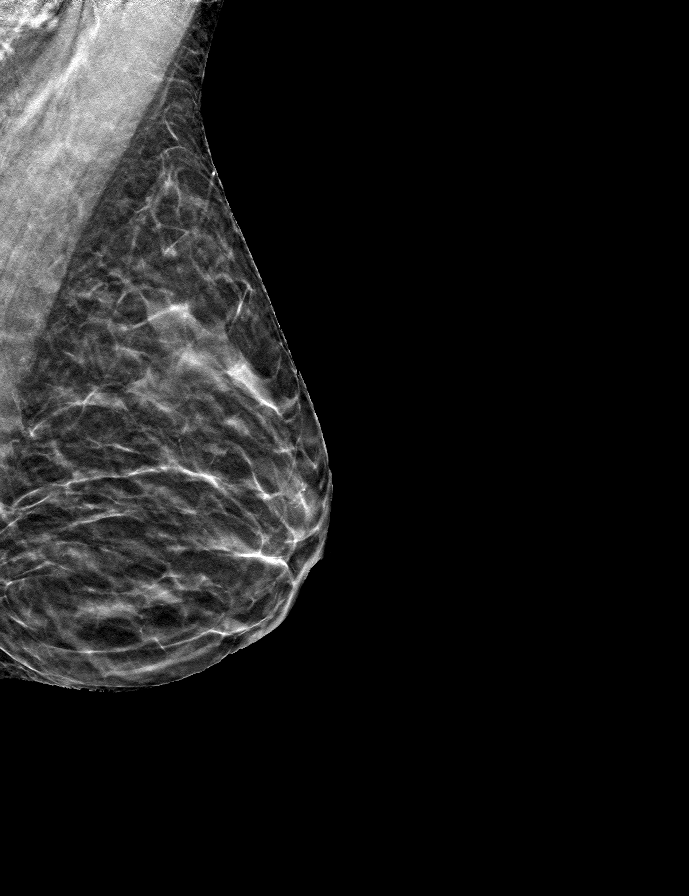

[R MLO tomo · tomo slice 17/33.0]
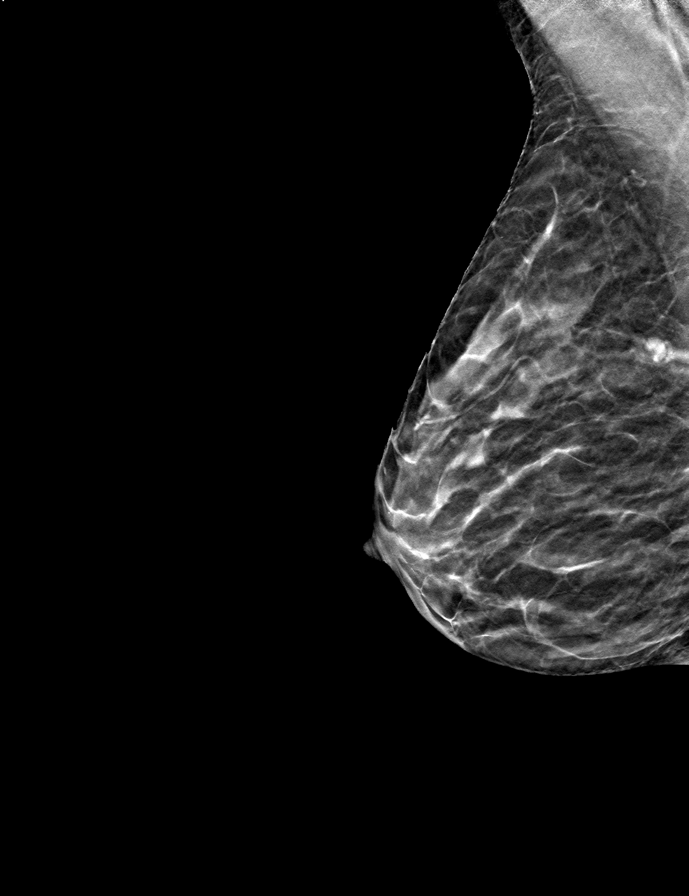

[R CC tomo · tomo slice 15/30.0]
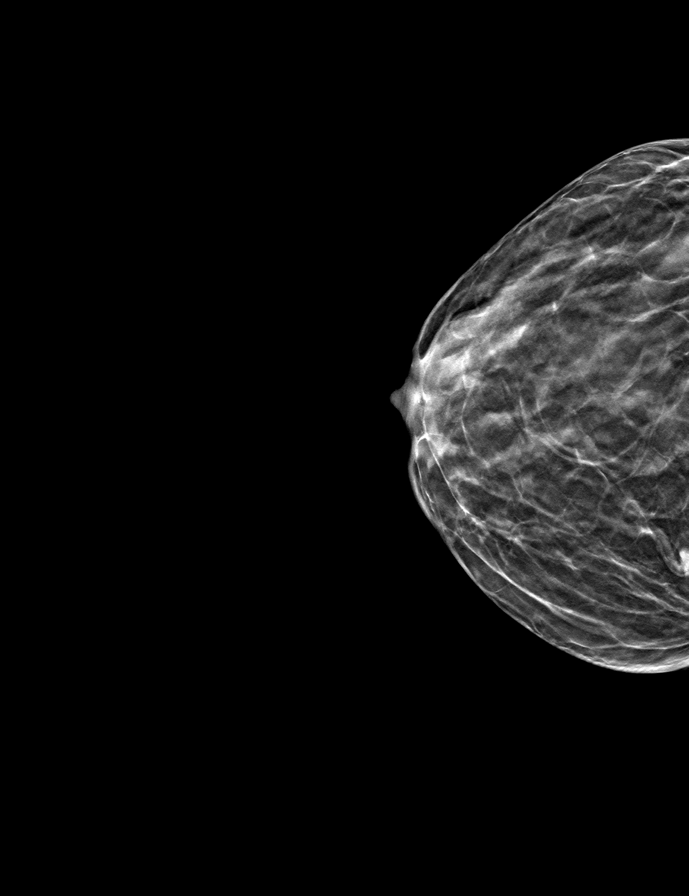

[L CC tomo · tomo slice 19/37.0]
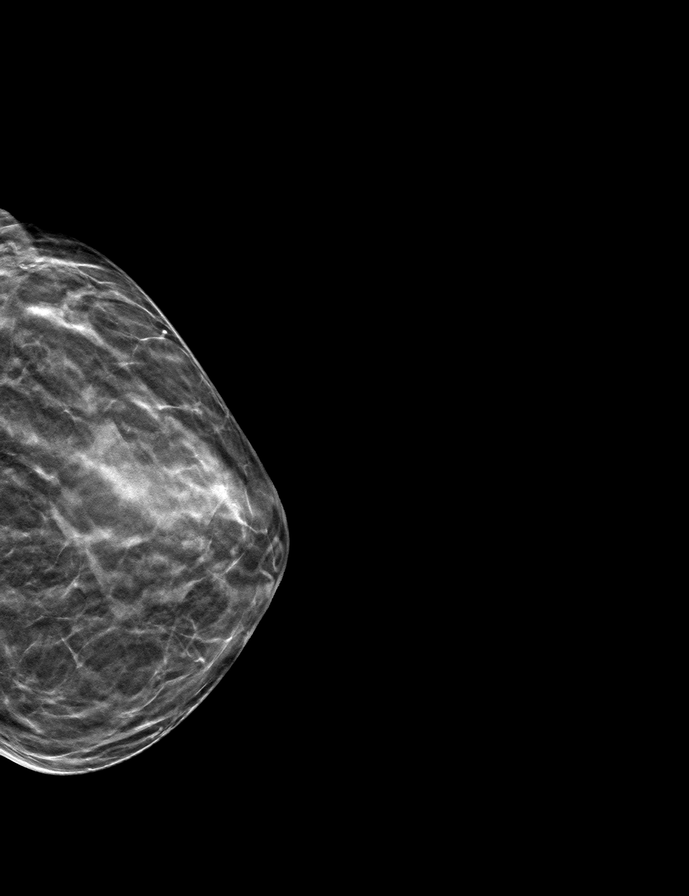

[L XCCL tomo · tomo slice 19/36.0]
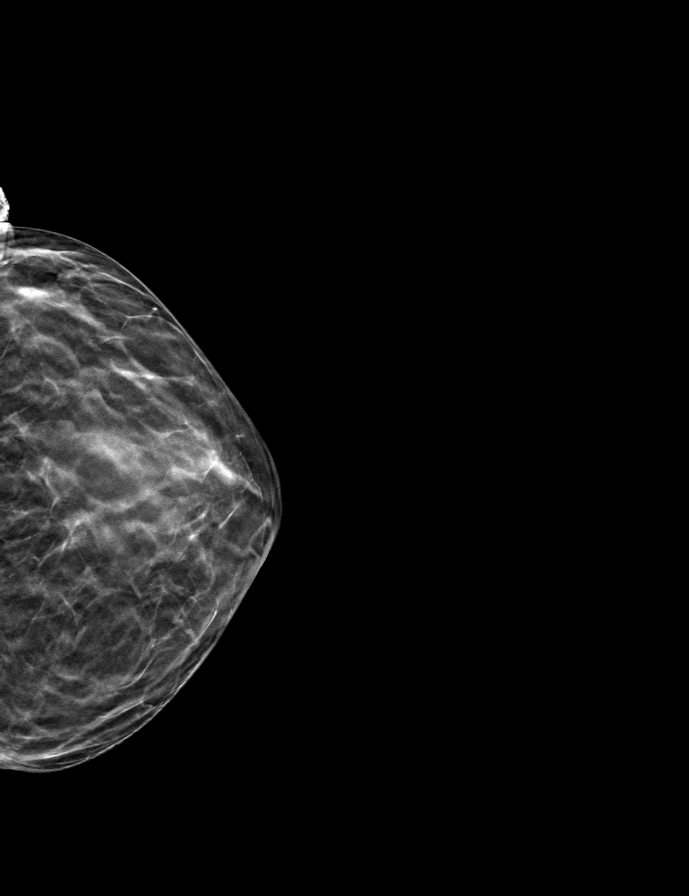

[9 of 28 positions shown; findings below may reference images not displayed]

ACR Breast Density Category b: There are scattered areas of
fibroglandular density.
FINDINGS: There are no findings suspicious for malignancy. Images were
processed with CAD.
IMPRESSION: No mammographic evidence of malignancy. A result letter of this
screening mammogram will be mailed directly to the patient.

RECOMMENDATION:
Screening mammogram in one year. (Code:[id number])

BI-RADS CATEGORY  1: Negative.

## 2019-10-06 ENCOUNTER — Other Ambulatory Visit: Payer: Self-pay

## 2020-03-20 ENCOUNTER — Other Ambulatory Visit: Payer: Self-pay | Admitting: Obstetrics and Gynecology

## 2020-03-20 DIAGNOSIS — Z1231 Encounter for screening mammogram for malignant neoplasm of breast: Secondary | ICD-10-CM

## 2020-04-05 ENCOUNTER — Ambulatory Visit: Payer: BLUE CROSS/BLUE SHIELD

## 2021-04-23 ENCOUNTER — Encounter: Payer: Self-pay | Admitting: Internal Medicine
